# Patient Record
Sex: Female | Born: 1960 | Race: White | Hispanic: No | Marital: Married | Smoking: Never smoker
Health system: Southern US, Community
[De-identification: ages and names within clinical notes are randomized; demographics above are authoritative.]

## PROBLEM LIST (undated history)

## (undated) DIAGNOSIS — J45909 Unspecified asthma, uncomplicated: Secondary | ICD-10-CM

## (undated) DIAGNOSIS — K219 Gastro-esophageal reflux disease without esophagitis: Secondary | ICD-10-CM

## (undated) DIAGNOSIS — N809 Endometriosis, unspecified: Secondary | ICD-10-CM

## (undated) DIAGNOSIS — K5792 Diverticulitis of intestine, part unspecified, without perforation or abscess without bleeding: Secondary | ICD-10-CM

## (undated) DIAGNOSIS — D351 Benign neoplasm of parathyroid gland: Secondary | ICD-10-CM

## (undated) DIAGNOSIS — T8859XA Other complications of anesthesia, initial encounter: Secondary | ICD-10-CM

## (undated) DIAGNOSIS — Z9889 Other specified postprocedural states: Secondary | ICD-10-CM

## (undated) DIAGNOSIS — Z79899 Other long term (current) drug therapy: Secondary | ICD-10-CM

## (undated) DIAGNOSIS — R112 Nausea with vomiting, unspecified: Secondary | ICD-10-CM

## (undated) DIAGNOSIS — K30 Functional dyspepsia: Secondary | ICD-10-CM

## (undated) DIAGNOSIS — T4145XA Adverse effect of unspecified anesthetic, initial encounter: Secondary | ICD-10-CM

## (undated) HISTORY — PX: FOOT SURGERY: SHX648

## (undated) HISTORY — DX: Other long term (current) drug therapy: Z79.899

## (undated) HISTORY — PX: COLON SURGERY: SHX602

## (undated) HISTORY — DX: Unspecified asthma, uncomplicated: J45.909

## (undated) HISTORY — PX: RECONSTRUCTION OF NOSE: SHX2301

## (undated) HISTORY — DX: Endometriosis, unspecified: N80.9

## (undated) HISTORY — PX: SALPINGOOPHORECTOMY: SHX82

## (undated) HISTORY — DX: Diverticulitis of intestine, part unspecified, without perforation or abscess without bleeding: K57.92

## (undated) HISTORY — PX: OTHER SURGICAL HISTORY: SHX169

## (undated) HISTORY — PX: SKIN SURGERY: SHX2413

## (undated) HISTORY — PX: TONSILLECTOMY: SUR1361

## (undated) HISTORY — PX: ABDOMINAL HYSTERECTOMY: SHX81

## (undated) HISTORY — PX: BREAST REDUCTION SURGERY: SHX8

## (undated) HISTORY — DX: Functional dyspepsia: K30

---

## 1997-06-05 HISTORY — PX: CHOLECYSTECTOMY: SHX55

## 2011-05-10 HISTORY — PX: ESOPHAGOGASTRODUODENOSCOPY: SHX1529

## 2012-06-25 ENCOUNTER — Ambulatory Visit: Payer: Self-pay | Admitting: Urgent Care

## 2012-06-27 ENCOUNTER — Encounter: Payer: Self-pay | Admitting: Urgent Care

## 2012-06-27 ENCOUNTER — Ambulatory Visit (INDEPENDENT_AMBULATORY_CARE_PROVIDER_SITE_OTHER): Payer: BC Managed Care – PPO | Admitting: Urgent Care

## 2012-06-27 ENCOUNTER — Encounter (HOSPITAL_COMMUNITY): Payer: Self-pay | Admitting: Pharmacy Technician

## 2012-06-27 VITALS — BP 130/86 | HR 68 | Temp 98.4°F | Ht 62.0 in | Wt 193.8 lb

## 2012-06-27 DIAGNOSIS — R1032 Left lower quadrant pain: Secondary | ICD-10-CM

## 2012-06-27 DIAGNOSIS — Z1211 Encounter for screening for malignant neoplasm of colon: Secondary | ICD-10-CM

## 2012-06-27 MED ORDER — PEG 3350-KCL-NA BICARB-NACL 420 G PO SOLR: 4000.0000 mL | ORAL | Status: DC

## 2012-06-27 NOTE — Progress Notes (Signed)
Referring Provider: Dr Emelda Fear, Denise Mourning, NP @ [facility] Primary Care Physician:  Denise Burrow, MD Primary Gastroenterologist:  Dr. Jonette Taylor  Chief Complaint  Patient presents with  . Colonoscopy    HPI:  Denise Taylor is a 52 y.o. female here as a referral from Dr. Emelda Fear for left lower quadrant pain.  [name] describes a nagging pressure in her LLQ for several months.  She presented to her GYN NP Denise Taylor for evaluation as she has hx of endometriosis & thought she may have had recurrent problems with this.  She had a normal pelvic ultrasound.  Her LLQ pain has almost resolved at this point.  She describes it as 1/10 nagging pressure that radiates into her rectum.   She denies any fever, chills, nausea or vomiting.  She denies any constipation or diarrhea.  Her weight has been stable.  Her appetite is good.  No aggravating or alleviating factors.    Past Medical History  Diagnosis Date  . Endometriosis     Past Surgical History  Procedure Date  . Abdominal hysterectomy     partial  . Esophagogastroduodenoscopy 05/10/11    Teena Dunk: mild narrowing GE junction, Savory dilation  . Tonsillectomy   . Cholecystectomy 1999  . Breast reduction surgery   . Foot surgery     left  . Salpingoophorectomy     right  . Endometrial mass     Current Outpatient Prescriptions  Medication Sig Dispense Refill  . omeprazole (PRILOSEC) 20 MG capsule Take 20 mg by mouth daily as needed. Indigestion.      . polyethylene glycol-electrolytes (TRILYTE) 420 G solution Take 4,000 mLs by mouth as directed.  4000 mL  0  . VIVELLE-DOT 0.0375 MG/24HR Place 1 patch onto the skin 2 (two) times a week.         Allergies as of 06/27/2012  . (No Known Allergies)    Family History:There is no known family history of colorectal carcinoma , liver disease, or inflammatory bowel disease.  Problem Relation Age of Onset  . COPD Mother     History   Social History  . Marital  Status: Married    Spouse Name: N/A    Number of Children: 2  . Years of Education: N/A   Occupational History  . Pharmacist, [employer], [place]    Social History Main Topics  . Smoking status: Never Smoker   . Smokeless tobacco: Not on file  . Alcohol Use: Yes     Comment: 3-4 beers per week  . Drug Use: No  . Sexually Active: Not on file   Other Topics Concern  . Not on file   Social History Narrative   Lives w/ husband    Review of Systems: Gen: Denies any fever, chills, sweats, anorexia, fatigue, weakness, malaise, weight loss, and sleep disorder CV: Denies chest pain, angina, palpitations, syncope, orthopnea, PND, peripheral edema, and claudication. Resp: Denies dyspnea at rest, dyspnea with exercise, cough, sputum, wheezing, coughing up blood, and pleurisy. GI: Denies vomiting blood, jaundice, and fecal incontinence. GU : Denies urinary burning, blood in urine, urinary frequency, urinary hesitancy, nocturnal urination, and urinary incontinence. MS: Denies joint pain, limitation of movement, and swelling, stiffness, low back pain, extremity pain. Denies muscle weakness, cramps, atrophy.  Derm: Denies rash, itching, dry skin, hives, moles, warts, or unhealing ulcers.  Psych: Denies depression, anxiety, memory loss, suicidal ideation, hallucinations, paranoia, and confusion. Heme: Denies bruising, bleeding, and enlarged lymph nodes. Neuro:  Denies any  headaches, dizziness, paresthesias. Endo:  Denies any problems with DM, thyroid, adrenal function.  Physical Exam: BP 130/86  Pulse 68  Temp 98.4 F (36.9 C) (Oral)  Ht 5\' 2"  (1.575 m)  Wt 193 lb 12.8 oz (87.907 kg)  BMI 35.45 kg/m2 No LMP recorded. Patient has had a hysterectomy. General:   Alert,  Well-developed, well-nourished, pleasant and cooperative in NAD.  Accompanied by her precious 4-yr old nephew today. Head:  Normocephalic and atraumatic. Eyes:  Sclera clear, no icterus.   Conjunctiva pink. Ears:  Normal  auditory acuity. Nose:  No deformity, discharge, or lesions. Mouth:  No deformity or lesions,oropharynx pink & moist. Neck:  Supple; no masses or thyromegaly. Lungs:  Clear throughout to auscultation.   No wheezes, crackles, or rhonchi. No acute distress. Heart:  Regular rate and rhythm; no murmurs, clicks, rubs,  or gallops. Abdomen:  Normal bowel sounds.  No bruits.  Soft, non-distended without masses, hepatosplenomegaly or hernias noted.  +very mild LLQ tenderness to deep palpation.  No guarding or rebound tenderness.   Rectal:  Deferred. Msk:  Symmetrical without gross deformities. Normal posture. Pulses:  Normal pulses noted. Extremities:  No clubbing or edema. Neurologic:  Alert and oriented x4;  grossly normal neurologically. Skin:  Intact without significant lesions or rashes. Lymph Nodes:  No significant cervical adenopathy. Psych:  Alert and cooperative. Normal mood and affect.

## 2012-06-27 NOTE — Patient Instructions (Addendum)
Screening colonoscopy with Dr Darrick Penna If you develop worsening abdominal pain, fever, nausea or vomiting, please let us know Abdominal Pain Abdominal pain can be caused by many things. Your caregiver decides the seriousness of your pain by an examination and possibly blood tests and X-rays. Many cases can be observed and treated at home. Most abdominal pain is not caused by a disease and will probably improve without treatment. However, in many cases, more time must pass before a clear cause of the pain can be found. Before that point, it may not be known if you need more testing, or if hospitalization or surgery is needed. HOME CARE INSTRUCTIONS   Do not take laxatives unless directed by your caregiver.  Take pain medicine only as directed by your caregiver.  Only take over-the-counter or prescription medicines for pain, discomfort, or fever as directed by your caregiver.  Try a clear liquid diet (broth, tea, or water) for as long as directed by your caregiver. Slowly move to a bland diet as tolerated. SEEK IMMEDIATE MEDICAL CARE IF:   The pain does not go away.  You have a fever.  You keep throwing up (vomiting).  The pain is felt only in portions of the abdomen. Pain in the right side could possibly be appendicitis. In an adult, pain in the left lower portion of the abdomen could be colitis or diverticulitis.  You pass bloody or black tarry stools. MAKE SURE YOU:   Understand these instructions.  Will watch your condition.  Will get help right away if you are not doing well or get worse. Document Released: 03/01/2005 Document Revised: 08/14/2011 Document Reviewed: 01/08/2008 [facility] Patient Information 2013 [place], [place].

## 2012-06-27 NOTE — Progress Notes (Signed)
Faxed to PCP

## 2012-06-27 NOTE — Assessment & Plan Note (Addendum)
Denise Taylor is a pleasant 52 y.o. female with nagging LLQ/pelvic pain she rates 1/10 without any associated symptoms, aggravating or alleviating factors.  Hx endometriosis with recent normal pelvic ultrasound.  No alarm features.  I suspect her pain may be due to endometriosis or adhesive disease.  Her symptoms would be atypical for diverticulitis.  She has never had a screening colonoscopy before.  Screening colonoscopy with Dr Darrick Penna.  I have discussed risks & benefits which include, but are not limited to, bleeding, infection, perforation & drug reaction.  The patient agrees with this plan & written consent will be obtained.    Abdominal pain literature given Advised to call if she develops worsening abdominal pain, fever, nausea or vomiting

## 2012-07-05 ENCOUNTER — Encounter (HOSPITAL_COMMUNITY): Payer: Self-pay

## 2012-07-05 ENCOUNTER — Ambulatory Visit (HOSPITAL_COMMUNITY)
Admission: RE | Admit: 2012-07-05 | Discharge: 2012-07-05 | Disposition: A | Discharge status: Home / Self Care | Payer: BC Managed Care – PPO | Source: Ambulatory Visit | Attending: Gastroenterology | Admitting: Gastroenterology

## 2012-07-05 ENCOUNTER — Encounter (HOSPITAL_COMMUNITY)
Admission: RE | Disposition: A | Discharge status: Home / Self Care | Payer: Self-pay | Source: Ambulatory Visit | Attending: Gastroenterology

## 2012-07-05 DIAGNOSIS — D126 Benign neoplasm of colon, unspecified: Secondary | ICD-10-CM

## 2012-07-05 DIAGNOSIS — K5730 Diverticulosis of large intestine without perforation or abscess without bleeding: Secondary | ICD-10-CM | POA: Insufficient documentation

## 2012-07-05 DIAGNOSIS — Z1211 Encounter for screening for malignant neoplasm of colon: Secondary | ICD-10-CM | POA: Insufficient documentation

## 2012-07-05 DIAGNOSIS — K648 Other hemorrhoids: Secondary | ICD-10-CM

## 2012-07-05 HISTORY — PX: COLONOSCOPY: SHX5424

## 2012-07-05 SURGERY — COLONOSCOPY
Anesthesia: Moderate Sedation

## 2012-07-05 MED ORDER — ONDANSETRON HCL 4 MG/2ML IJ SOLN
4.0000 mg | Freq: Once | INTRAMUSCULAR | Status: AC
  Administered 2012-07-05: 4 mg via INTRAVENOUS

## 2012-07-05 MED ORDER — MIDAZOLAM HCL 5 MG/5ML IJ SOLN
INTRAMUSCULAR | Status: AC
  Filled 2012-07-05: qty 10

## 2012-07-05 MED ORDER — MEPERIDINE HCL 100 MG/ML IJ SOLN
INTRAMUSCULAR | Status: AC
  Filled 2012-07-05: qty 2

## 2012-07-05 MED ORDER — SODIUM CHLORIDE 0.45 % IV SOLN
INTRAVENOUS | Status: DC
  Administered 2012-07-05: 09:00:00 via INTRAVENOUS

## 2012-07-05 MED ORDER — MIDAZOLAM HCL 5 MG/5ML IJ SOLN
INTRAMUSCULAR | Status: DC | PRN
  Administered 2012-07-05: 1 mg via INTRAVENOUS
  Administered 2012-07-05 (×2): 2 mg via INTRAVENOUS

## 2012-07-05 MED ORDER — ONDANSETRON HCL 4 MG/2ML IJ SOLN
INTRAMUSCULAR | Status: AC
  Filled 2012-07-05: qty 2

## 2012-07-05 MED ORDER — MEPERIDINE HCL 100 MG/ML IJ SOLN
INTRAMUSCULAR | Status: DC | PRN
  Administered 2012-07-05: 50 mg via INTRAVENOUS
  Administered 2012-07-05: 25 mg via INTRAVENOUS

## 2012-07-05 NOTE — H&P (Signed)
  Primary Care Physician:  Selinda Flavin, MD Primary Gastroenterologist:  Dr. Darrick Penna  Pre-Procedure History & Physical: HPI:  Denise Taylor is a 52 y.o. female here for COLON CANCER SCREENING.  Past Medical History  Diagnosis Date  . Endometriosis     Past Surgical History  Procedure Date  . Abdominal hysterectomy     partial  . Esophagogastroduodenoscopy 05/10/11    Teena Dunk: mild narrowing GE junction, Savory dilation  . Tonsillectomy   . Cholecystectomy 1999  . Breast reduction surgery   . Foot surgery     left  . Salpingoophorectomy     right  . Endometrial mass     Prior to Admission medications   Medication Sig Start Date End Date Taking? Authorizing Provider  omeprazole (PRILOSEC) 20 MG capsule Take 20 mg by mouth daily as needed. Indigestion.   Yes Historical Provider, MD  VIVELLE-DOT 0.0375 MG/24HR Place 1 patch onto the skin 2 (two) times a week.  03/25/12  Yes Historical Provider, MD    Allergies as of 06/27/2012  . (No Known Allergies)    Family History  Problem Relation Age of Onset  . COPD Mother     History   Social History  . Marital Status: Married    Spouse Name: N/A    Number of Children: 2  . Years of Education: N/A   Occupational History  . Pharmacist, [employer], [place]    Social History Main Topics  . Smoking status: Never Smoker   . Smokeless tobacco: Not on file  . Alcohol Use: Yes     Comment: 3-4 beers per week  . Drug Use: No  . Sexually Active: Not on file   Other Topics Concern  . Not on file   Social History Narrative   Lives w/ husband    Review of Systems: See HPI, otherwise negative ROS   Physical Exam: BP 127/81  Pulse 66  Temp 97.5 F (36.4 C)  Resp 22  Ht 5\' 2"  (1.575 m)  Wt 193 lb (87.544 kg)  BMI 35.30 kg/m2  SpO2 98% General:   Alert,  pleasant and cooperative in NAD Head:  Normocephalic and atraumatic. Neck:  Supple; Lungs:  Clear throughout to auscultation.    Heart:  Regular rate and  rhythm. Abdomen:  Soft, nontender and nondistended. Normal bowel sounds, without guarding, and without rebound.   Neurologic:  Alert and  oriented x4;  grossly normal neurologically.  Impression/Plan:    SCREENING  Plan:  1. TCS TODAY

## 2012-07-05 NOTE — Op Note (Signed)
[facility] [address] [place], [address]   COLONOSCOPY PROCEDURE REPORT  PATIENT: Denise Taylor, Denise Taylor  MR#: [id number] BIRTHDATE: 1961-01-08 , 51  yrs. old GENDER: Female ENDOSCOPIST: Jonette Eva, MD REFERRED XB:JYNWG Howard, M.D. PROCEDURE DATE:  07/05/2012 PROCEDURE:   ILEOColonoscopy with cold biopsy polypectomy INDICATIONS:Average risk patient for colon cancer. MEDICATIONS: Demerol 75 mg IV and Versed 5 mg IV  DESCRIPTION OF PROCEDURE:    Physical exam was performed.  Informed consent was obtained from the patient after explaining the benefits, risks, and alternatives to procedure.  The patient was connected to monitor and placed in left lateral position. Continuous oxygen was provided by nasal cannula and IV medicine administered through an indwelling cannula.  After administration of sedation and rectal exam, the patients rectum was intubated and the Pentax Colonoscope [phone]  colonoscope was advanced under direct visualization to the ileum.  The scope was removed slowly by carefully examining the color, texture, anatomy, and integrity mucosa on the way out.  The patient was recovered in endoscopy and discharged home in satisfactory condition.    COLON FINDINGS: Two smooth polyps measuring 3-4 mm in size were found in the descending colon and sigmoid colon.  A polypectomy was performed with cold forceps.  , Moderate diverticulosis was noted in the transverse colon, descending colon, and sigmoid colon.  , and Small internal hemorrhoids were found.  PREP QUALITY: good. CECAL W/D TIME: 9 minutes COMPLICATIONS: None  ENDOSCOPIC IMPRESSION: 1.   Two COLON polyps 2.   Moderate diverticulosis was noted in the transverse colon, descending colon, and sigmoid colon 3.   Small internal hemorrhoids  RECOMMENDATIONS: FOLLOW A HIGH FIBER DIET.  AVOID ITEMS THAT CAUSE BLOATING. BIOPSY RESULTS SHOULD BE BACK IN 7 DAYS. Next colonoscopy in 10  years.       _______________________________ Rosalie DoctorJonette Eva, MD 07/05/2012 2:59 PM     PATIENT NAME:  Denise Taylor, Denise Taylor MR#: [id number]

## 2012-07-10 ENCOUNTER — Telehealth: Payer: Self-pay | Admitting: Gastroenterology

## 2012-07-10 NOTE — Telephone Encounter (Signed)
Please call pt. SHE had A simple adenoma removed from HER colon. FOLLOW A High fiber diet. TCS IN 10 YEARS.

## 2012-07-10 NOTE — Telephone Encounter (Signed)
Called and informed pt.  

## 2012-07-10 NOTE — Telephone Encounter (Signed)
Path faxed to PCP, recall made 

## 2012-10-01 NOTE — Progress Notes (Signed)
TCS JAN 2014 SIMPLE ADENOMA(1), pTICS, IH  REVIEWED.

## 2012-11-28 ENCOUNTER — Other Ambulatory Visit: Payer: Self-pay | Admitting: *Deleted

## 2012-11-28 MED ORDER — ESTRADIOL 0.0375 MG/24HR TD PTTW: 1.0000 | MEDICATED_PATCH | TRANSDERMAL | Status: DC

## 2014-01-21 ENCOUNTER — Other Ambulatory Visit: Payer: Self-pay | Admitting: Adult Health

## 2014-02-13 ENCOUNTER — Ambulatory Visit (INDEPENDENT_AMBULATORY_CARE_PROVIDER_SITE_OTHER): Payer: BC Managed Care – PPO | Admitting: Adult Health

## 2014-02-13 ENCOUNTER — Encounter: Payer: Self-pay | Admitting: Adult Health

## 2014-02-13 VITALS — BP 130/80 | HR 74 | Ht 62.5 in | Wt 183.0 lb

## 2014-02-13 DIAGNOSIS — K30 Functional dyspepsia: Secondary | ICD-10-CM

## 2014-02-13 DIAGNOSIS — Z79899 Other long term (current) drug therapy: Secondary | ICD-10-CM

## 2014-02-13 DIAGNOSIS — Z1212 Encounter for screening for malignant neoplasm of rectum: Secondary | ICD-10-CM

## 2014-02-13 DIAGNOSIS — Z79818 Long term (current) use of other agents affecting estrogen receptors and estrogen levels: Secondary | ICD-10-CM

## 2014-02-13 DIAGNOSIS — Z01419 Encounter for gynecological examination (general) (routine) without abnormal findings: Secondary | ICD-10-CM

## 2014-02-13 HISTORY — DX: Long term (current) use of other agents affecting estrogen receptors and estrogen levels: Z79.818

## 2014-02-13 HISTORY — DX: Functional dyspepsia: K30

## 2014-02-13 HISTORY — DX: Other long term (current) drug therapy: Z79.899

## 2014-02-13 LAB — HEMOCCULT GUIAC POC 1CARD (OFFICE): FECAL OCCULT BLD: NEGATIVE

## 2014-02-13 MED ORDER — ESTRADIOL 0.0375 MG/24HR TD PTTW: MEDICATED_PATCH | TRANSDERMAL | Status: DC

## 2014-02-13 MED ORDER — OMEPRAZOLE 20 MG PO CPDR: 20.0000 mg | DELAYED_RELEASE_CAPSULE | Freq: Every day | ORAL | Status: DC | PRN

## 2014-02-13 NOTE — Patient Instructions (Signed)
Physical in 1 year Mammogram now and yearly colonoscopy 2024

## 2014-02-13 NOTE — Progress Notes (Signed)
Patient ID: Denise Taylor, female   DOB: 09-22-1960, 53 y.o.   MRN: [id number] History of Present Illness: Denise Taylor is a 53 year old white female in for a physical, has some indigestion using OTC prilosec, is still using patches and is happy.She has gotten her flu shot for this year.   Current Medications, Allergies, Past Medical History, Past Surgical History, Family History and Social History were reviewed in [employer] record.     Review of Systems: Patient denies any headaches, blurred vision, shortness of breath, chest pain, abdominal pain, problems with bowel movements, urination, or intercourse. No joint pain or mood swings.    Physical Exam: BP 130/80  Pulse 74  Ht 5' 2.5" (1.588 m)  Wt 183 lb (83.008 kg)  BMI 32.92 kg/m2 General:  Well developed, well nourished, no acute distress Skin:  Warm and dry Neck:  Midline trachea, normal thyroid Lungs; Clear to auscultation bilaterally Breast:  No dominant palpable mass, retraction, or nipple discharge,sp reduction  And has some irregularities bilaterally Cardiovascular: Regular rate and rhythm Abdomen:  Soft, non tender, no hepatosplenomegaly Pelvic:  External genitalia is normal in appearance.  The vagina is normal in appearance.The cervix and uterus are absent. No  adnexal masses or tenderness noted. Rectal: Good sphincter tone, no polyps, or hemorrhoids felt.  Hemoccult negative. 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

## 2014-04-06 ENCOUNTER — Encounter: Payer: Self-pay | Admitting: Adult Health

## 2015-02-04 ENCOUNTER — Other Ambulatory Visit: Payer: Self-pay | Admitting: *Deleted

## 2015-02-09 MED ORDER — ESTRADIOL 0.0375 MG/24HR TD PTTW: MEDICATED_PATCH | TRANSDERMAL | Status: DC

## 2015-02-17 ENCOUNTER — Emergency Department (HOSPITAL_COMMUNITY)
Admission: EM | Admit: 2015-02-17 | Discharge: 2015-02-18 | Disposition: A | Discharge status: Home / Self Care | Payer: 59 | Attending: Emergency Medicine | Admitting: Emergency Medicine

## 2015-02-17 ENCOUNTER — Encounter (HOSPITAL_COMMUNITY): Payer: Self-pay | Admitting: *Deleted

## 2015-02-17 ENCOUNTER — Emergency Department (HOSPITAL_COMMUNITY): Payer: 59

## 2015-02-17 DIAGNOSIS — Z9049 Acquired absence of other specified parts of digestive tract: Secondary | ICD-10-CM | POA: Diagnosis not present

## 2015-02-17 DIAGNOSIS — K5732 Diverticulitis of large intestine without perforation or abscess without bleeding: Secondary | ICD-10-CM

## 2015-02-17 DIAGNOSIS — Z8742 Personal history of other diseases of the female genital tract: Secondary | ICD-10-CM | POA: Insufficient documentation

## 2015-02-17 DIAGNOSIS — R350 Frequency of micturition: Secondary | ICD-10-CM | POA: Diagnosis not present

## 2015-02-17 DIAGNOSIS — R51 Headache: Secondary | ICD-10-CM | POA: Diagnosis not present

## 2015-02-17 DIAGNOSIS — R1084 Generalized abdominal pain: Secondary | ICD-10-CM | POA: Diagnosis present

## 2015-02-17 DIAGNOSIS — K5730 Diverticulosis of large intestine without perforation or abscess without bleeding: Secondary | ICD-10-CM | POA: Diagnosis not present

## 2015-02-17 DIAGNOSIS — R110 Nausea: Secondary | ICD-10-CM | POA: Diagnosis not present

## 2015-02-17 DIAGNOSIS — Z90710 Acquired absence of both cervix and uterus: Secondary | ICD-10-CM | POA: Diagnosis not present

## 2015-02-17 LAB — BASIC METABOLIC PANEL
ANION GAP: 7 (ref 5–15)
BUN: 14 mg/dL (ref 6–20)
CALCIUM: 8.6 mg/dL — AB (ref 8.9–10.3)
CO2: 27 mmol/L (ref 22–32)
Chloride: 103 mmol/L (ref 101–111)
Creatinine, Ser: 0.99 mg/dL (ref 0.44–1.00)
GLUCOSE: 105 mg/dL — AB (ref 65–99)
POTASSIUM: 3.9 mmol/L (ref 3.5–5.1)
SODIUM: 137 mmol/L (ref 135–145)

## 2015-02-17 LAB — CBC WITH DIFFERENTIAL/PLATELET
BASOS ABS: 0 10*3/uL (ref 0.0–0.1)
BASOS PCT: 0 %
EOS ABS: 0.5 10*3/uL (ref 0.0–0.7)
EOS PCT: 5 %
HCT: 37.5 % (ref 36.0–46.0)
Hemoglobin: 12.7 g/dL (ref 12.0–15.0)
Lymphocytes Relative: 20 %
Lymphs Abs: 2.2 10*3/uL (ref 0.7–4.0)
MCH: 33 pg (ref 26.0–34.0)
MCHC: 33.9 g/dL (ref 30.0–36.0)
MCV: 97.4 fL (ref 78.0–100.0)
MONO ABS: 0.9 10*3/uL (ref 0.1–1.0)
Monocytes Relative: 8 %
Neutro Abs: 7.4 10*3/uL (ref 1.7–7.7)
Neutrophils Relative %: 67 %
PLATELETS: 203 10*3/uL (ref 150–400)
RBC: 3.85 MIL/uL — AB (ref 3.87–5.11)
RDW: 12.3 % (ref 11.5–15.5)
WBC: 11.1 10*3/uL — AB (ref 4.0–10.5)

## 2015-02-17 LAB — URINALYSIS, ROUTINE W REFLEX MICROSCOPIC
BILIRUBIN URINE: NEGATIVE
GLUCOSE, UA: NEGATIVE mg/dL
Leukocytes, UA: NEGATIVE
NITRITE: NEGATIVE
PH: 6 (ref 5.0–8.0)
PROTEIN: NEGATIVE mg/dL
Specific Gravity, Urine: 1.025 (ref 1.005–1.030)
Urobilinogen, UA: 0.2 mg/dL (ref 0.0–1.0)

## 2015-02-17 LAB — URINE MICROSCOPIC-ADD ON

## 2015-02-17 MED ORDER — IBUPROFEN 800 MG PO TABS: 800.0000 mg | ORAL_TABLET | Freq: Three times a day (TID) | ORAL | Status: DC

## 2015-02-17 MED ORDER — METRONIDAZOLE 500 MG PO TABS
500.0000 mg | ORAL_TABLET | Freq: Once | ORAL | Status: AC
  Administered 2015-02-18: 500 mg via ORAL
  Filled 2015-02-17: qty 1

## 2015-02-17 MED ORDER — METRONIDAZOLE 500 MG PO TABS: 500.0000 mg | ORAL_TABLET | Freq: Two times a day (BID) | ORAL | Status: DC

## 2015-02-17 MED ORDER — FENTANYL CITRATE (PF) 100 MCG/2ML IJ SOLN
50.0000 ug | Freq: Once | INTRAMUSCULAR | Status: AC
  Administered 2015-02-17: 50 ug via INTRAVENOUS
  Filled 2015-02-17: qty 2

## 2015-02-17 MED ORDER — SODIUM CHLORIDE 0.9 % IV BOLUS (SEPSIS)
1000.0000 mL | Freq: Once | INTRAVENOUS | Status: AC
  Administered 2015-02-17: 1000 mL via INTRAVENOUS

## 2015-02-17 MED ORDER — IOHEXOL 300 MG/ML  SOLN
50.0000 mL | Freq: Once | INTRAMUSCULAR | Status: AC | PRN
  Administered 2015-02-17: 100 mL via INTRAVENOUS

## 2015-02-17 MED ORDER — CIPROFLOXACIN HCL 500 MG PO TABS: 500.0000 mg | ORAL_TABLET | Freq: Two times a day (BID) | ORAL | Status: DC

## 2015-02-17 MED ORDER — CIPROFLOXACIN IN D5W 400 MG/200ML IV SOLN
400.0000 mg | Freq: Once | INTRAVENOUS | Status: AC
  Administered 2015-02-18: 400 mg via INTRAVENOUS
  Filled 2015-02-17: qty 200

## 2015-02-17 MED ORDER — ONDANSETRON HCL 4 MG/2ML IJ SOLN
4.0000 mg | Freq: Once | INTRAMUSCULAR | Status: AC
  Administered 2015-02-17: 4 mg via INTRAVENOUS
  Filled 2015-02-17: qty 2

## 2015-02-17 MED ORDER — IOHEXOL 300 MG/ML  SOLN
50.0000 mL | Freq: Once | INTRAMUSCULAR | Status: AC | PRN
  Administered 2015-02-17: 50 mL via ORAL

## 2015-02-17 NOTE — ED Notes (Signed)
Pt with abd pain, denies N/V/D, LBM a week ago, denies burning on urination

## 2015-02-17 NOTE — Discharge Instructions (Signed)

## 2015-02-17 NOTE — ED Provider Notes (Signed)
CSN: [id number]     Arrival date & time 02/17/15  2124 History  This chart was scribed for Noemi Chapel, MD by Hilda Lias, ED Scribe. This patient was seen in room APA19/APA19 and the patient's care was started at 9:45 PM.    Chief Complaint  Patient presents with  . Abdominal Pain      The history is provided by the patient. No language interpreter was used.   HPI Comments: Denise Taylor is a 54 y.o. female who presents to the Emergency Department complaining of intermittent generalized abdominal pain that is worst in her RLQ with associated frequent urination, nausea, headache that has been present since last night. Pt states she began having pain last night when she was sleeping, which woke her up. Pt states she has not had a bowel movement in a week or so. Pt reports she always has blood in her urine, and states she has a hx of endometriosis. 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 Pt reports she still has her appendix.    Past Medical History  Diagnosis Date  . Endometriosis   . Current use of estrogen therapy 02/13/2014  . Indigestion 02/13/2014   Past Surgical History  Procedure Laterality Date  . Abdominal hysterectomy      partial  . Esophagogastroduodenoscopy  05/10/11    Britta Mccreedy: mild narrowing GE junction, Savory dilation  . Tonsillectomy    . Cholecystectomy  1999  . Breast reduction surgery    . Foot surgery      left  . Salpingoophorectomy      right  . Endometrial mass    . Colonoscopy  07/05/2012    Procedure: COLONOSCOPY;  Surgeon: Danie Binder, MD;  Location: [facility];  Service: Endoscopy;  Laterality: N/A;  9:30  . Skin surgery      back of left leg   Family History  Problem Relation Age of Onset  . COPD Mother   . Hypertension Father   . Other Brother     paralyzed from waist down; fell out of deer stand  . Other Daughter     born with 2 holes in  heart  . Heart disease Maternal Grandfather   . COPD Maternal Grandfather   . Stroke Paternal Grandmother   . Stroke Paternal Grandfather    Social History  Substance Use Topics  . Smoking status: Never Smoker   . Smokeless tobacco: Never Used  . Alcohol Use: Yes     Comment: once a week   OB History    Gravida Para Term Preterm AB TAB SAB Ectopic Multiple Living   2 2        2      Review of Systems  All other systems reviewed and are negative.     Allergies  Codeine  Home Medications   Prior to Admission medications   Medication Sig Start Date End Date Taking? Authorizing Provider  estradiol (VIVELLE-DOT) 0.0375 MG/24HR apply 1 patch two times a week Patient taking differently: Place 1 patch onto the skin 2 (two) times a week. apply 1 patch two times a week 02/09/15  Yes Estill Dooms, NP  omeprazole (PRILOSEC) 20 MG capsule Take 1 capsule (20 mg total) by mouth daily as needed. Indigestion. 02/13/14  Yes Estill Dooms, NP  ciprofloxacin (CIPRO) 500 MG tablet Take 1 tablet (500 mg total) by mouth every  12 (twelve) hours. 02/17/15   Noemi Chapel, MD  ibuprofen (ADVIL,MOTRIN) 800 MG tablet Take 1 tablet (800 mg total) by mouth 3 (three) times daily. 02/17/15   Noemi Chapel, MD  metroNIDAZOLE (FLAGYL) 500 MG tablet Take 1 tablet (500 mg total) by mouth 2 (two) times daily. 02/17/15   Noemi Chapel, MD   BP 121/70 mmHg  Pulse 86  Temp(Src) 98.5 F (36.9 C) (Oral)  Resp 18  Ht 5\' 2"  (1.575 m)  Wt 189 lb (85.73 kg)  BMI 34.56 kg/m2  SpO2 100% Physical Exam  Constitutional: She appears well-developed and well-nourished. No distress.  HENT:  Head: Normocephalic and atraumatic.  Mouth/Throat: Oropharynx is clear and moist. No oropharyngeal exudate.  Eyes: Conjunctivae and EOM are normal. Pupils are equal, round, and reactive to light. Right eye exhibits no discharge. Left eye exhibits no discharge. No scleral icterus.  Neck: Normal range of motion. Neck supple. No JVD  present. No thyromegaly present.  Cardiovascular: Normal rate, regular rhythm, normal heart sounds and intact distal pulses.  Exam reveals no gallop and no friction rub.   No murmur heard. Pulmonary/Chest: Effort normal and breath sounds normal. No respiratory distress. She has no wheezes. She has no rales.  Abdominal: Soft. Bowel sounds are normal. She exhibits no distension and no mass. There is no tenderness.  Tenderness to McBurney's point on the right  Musculoskeletal: Normal range of motion. She exhibits no edema or tenderness.  Lymphadenopathy:    She has no cervical adenopathy.  Neurological: She is alert. Coordination normal.  Skin: Skin is warm and dry. No rash noted. No erythema.  Psychiatric: She has a normal mood and affect. Her behavior is normal.  Nursing note and vitals reviewed.   ED Course  Procedures (including critical care time)  DIAGNOSTIC STUDIES: Oxygen Saturation is 100% on room air, normal by my interpretation.    COORDINATION OF CARE: 9:48 PM Discussed treatment plan with pt at bedside and pt agreed to plan.   Labs Review Labs Reviewed  CBC WITH DIFFERENTIAL/PLATELET - Abnormal; Notable for the following:    WBC 11.1 (*)    RBC 3.85 (*)    All other components within normal limits  BASIC METABOLIC PANEL - Abnormal; Notable for the following:    Glucose, Bld 105 (*)    Calcium 8.6 (*)    All other components within normal limits  URINALYSIS, ROUTINE W REFLEX MICROSCOPIC (NOT AT [facility]) - Abnormal; Notable for the following:    Hgb urine dipstick MODERATE (*)    Ketones, ur TRACE (*)    All other components within normal limits  URINE MICROSCOPIC-ADD ON    Imaging Review Ct Abdomen Pelvis W Contrast  02/17/2015   CLINICAL DATA:  54 year old female with right lower quadrant abdominal pain  EXAM: CT ABDOMEN AND PELVIS WITH CONTRAST  TECHNIQUE: Multidetector CT imaging of the abdomen and pelvis was performed using the standard protocol following bolus  administration of intravenous contrast.  CONTRAST:  84mL OMNIPAQUE IOHEXOL 300 MG/ML SOLN, 189mL OMNIPAQUE IOHEXOL 300 MG/ML SOLN  COMPARISON:  None.  FINDINGS: The visualized lung bases are clear. No intra-abdominal free air. Small free fluid within the pelvis.  Cholecystectomy. The liver, pancreas, spleen, adrenal glands appear unremarkable. The right kidney and ureter appears unremarkable. There are multiple small left renal probable parapelvic cysts. A 1 cm left renal inferior pole hypodense lesion is not well characterized. Ultrasound is recommended for better evaluation of the kidneys. The urinary bladder appears unremarkable. Hysterectomy.  There is sigmoid diverticulosis with active inflammation compatible with diverticulitis. No drainable fluid collection/abscess. No evidence of perforation. Moderate stool noted throughout the colon. No bowel obstruction. Top-normal caliber loops of small bowel noted within the pelvis likely related to a degree of ileus. The appendix is unremarkable.  The abdominal aorta and IVC appear unremarkable. No portal venous gas identified. There is no lymphadenopathy. The visualized osseous structures appear unremarkable.  IMPRESSION: Sigmoid diverticulosis.  No abscess.  Stop   Electronically Signed   By: Anner Crete M.D.   On: 02/17/2015 23:19   I have personally reviewed and evaluated these images and lab results as part of my medical decision-making.    MDM   Final diagnoses:  Sigmoid diverticulitis    The patient has diverticulitis on CT scan, labs otherwise fairly unremarkable, the patient was informed of her results, she has received medications including IV antibiotics, IV medications and IV fluids. She is feeling better, she wants ibuprofen for home but no opiate medications. She appears stable for discharge after antibiotics given. She has expressed her understanding to the indications for return.  I personally performed the services described in this  documentation, which was scribed in my presence. The recorded information has been reviewed and is accurate.    Meds given in ED:  Medications  ciprofloxacin (CIPRO) IVPB 400 mg (not administered)  metroNIDAZOLE (FLAGYL) tablet 500 mg (not administered)  fentaNYL (SUBLIMAZE) injection 50 mcg (50 mcg Intravenous Given 02/17/15 2214)  ondansetron (ZOFRAN) injection 4 mg (4 mg Intravenous Given 02/17/15 2214)  sodium chloride 0.9 % bolus 1,000 mL (0 mLs Intravenous Stopped 02/17/15 2323)  iohexol (OMNIPAQUE) 300 MG/ML solution 50 mL (100 mLs Intravenous Contrast Given 02/17/15 2246)  iohexol (OMNIPAQUE) 300 MG/ML solution 50 mL (50 mLs Oral Contrast Given 02/17/15 2246)    New Prescriptions   CIPROFLOXACIN (CIPRO) 500 MG TABLET    Take 1 tablet (500 mg total) by mouth every 12 (twelve) hours.   IBUPROFEN (ADVIL,MOTRIN) 800 MG TABLET    Take 1 tablet (800 mg total) by mouth 3 (three) times daily.   METRONIDAZOLE (FLAGYL) 500 MG TABLET    Take 1 tablet (500 mg total) by mouth 2 (two) times daily.       Noemi Chapel, MD 02/17/15 [phone]

## 2016-04-06 ENCOUNTER — Other Ambulatory Visit: Payer: Self-pay | Admitting: Adult Health

## 2016-04-18 ENCOUNTER — Encounter: Payer: Self-pay | Admitting: Adult Health

## 2016-04-18 ENCOUNTER — Ambulatory Visit (INDEPENDENT_AMBULATORY_CARE_PROVIDER_SITE_OTHER): Payer: 59 | Admitting: Adult Health

## 2016-04-18 VITALS — BP 130/72 | HR 82 | Ht 62.5 in | Wt 193.4 lb

## 2016-04-18 DIAGNOSIS — Z1211 Encounter for screening for malignant neoplasm of colon: Secondary | ICD-10-CM

## 2016-04-18 DIAGNOSIS — Z01419 Encounter for gynecological examination (general) (routine) without abnormal findings: Secondary | ICD-10-CM

## 2016-04-18 DIAGNOSIS — Z79899 Other long term (current) drug therapy: Secondary | ICD-10-CM

## 2016-04-18 LAB — HEMOCCULT GUIAC POC 1CARD (OFFICE): FECAL OCCULT BLD: NEGATIVE

## 2016-04-18 MED ORDER — ESTRADIOL 0.0375 MG/24HR TD PTTW: MEDICATED_PATCH | TRANSDERMAL | 4 refills | Status: DC

## 2016-04-18 NOTE — Progress Notes (Signed)
Patient ID: Denise Taylor, female   DOB: [birth year], 55 y.o.   MRN: [id number] History of Present Illness: Denise Taylor is a 55 year old white female, married in for well woman gyn exam, she is sp hysterectomy.She needs refills on her estrogen patches.  PCP is Dr Nadara Mustard.   Current Medications, Allergies, Past Medical History, Past Surgical History, Family History and Social History were reviewed in [employer] record.     Review of Systems: Patient denies any headaches, hearing loss, fatigue, blurred vision, shortness of breath, chest pain, abdominal pain, problems with  urination, or intercourse. No joint pain or mood swings. Has had diverticulosis, and BMs loose.  Physical Exam:BP 130/72   Pulse 82   Ht 5' 2.5" (1.588 m)   Wt 193 lb 6.4 oz (87.7 kg)   BMI 34.81 kg/m  General:  Well developed, well nourished, no acute distress Skin:  Warm and dry Neck:  Midline trachea, normal thyroid, good ROM, no lymphadenopathy Lungs; Clear to auscultation bilaterally Breast:  No dominant palpable mass, retraction, or nipple discharge Cardiovascular: Regular rate and rhythm Abdomen:  Soft, non tender, no hepatosplenomegaly Pelvic:  External genitalia is normal in appearance, no lesions.  The vagina is normal in appearance. Urethra has no lesions or masses. The cervix and uterus are absent.  No adnexal masses or tenderness noted.Bladder is non tender, no masses felt. Rectal: Good sphincter tone, no polyps, + hemorrhoids felt.  Hemoccult negative. Extremities/musculoskeletal:  No swelling or varicosities noted, no clubbing or cyanosis Psych:  No mood changes, alert and cooperative,seems happy PHQ 2 score 0  Impression: 1. Encounter for well woman exam with routine gynecological exam   2. Current use of estrogen therapy       Plan:  Refilled Vivelle dot 0.0375 mg #24 with 4 refills use 1 patch 2 x weekly Physical in 1 year Mammogram yearly colonoscopy per Dr Learta Codding  with PCP

## 2016-04-18 NOTE — Patient Instructions (Signed)
Physical in 1 year Mammogram yearly colonoscopy per Dr Learta Codding with PCP

## 2016-04-25 ENCOUNTER — Other Ambulatory Visit: Payer: Self-pay | Admitting: Adult Health

## 2016-07-27 DIAGNOSIS — L739 Follicular disorder, unspecified: Secondary | ICD-10-CM | POA: Diagnosis not present

## 2016-07-27 DIAGNOSIS — D2239 Melanocytic nevi of other parts of face: Secondary | ICD-10-CM | POA: Diagnosis not present

## 2016-07-27 DIAGNOSIS — D485 Neoplasm of uncertain behavior of skin: Secondary | ICD-10-CM | POA: Diagnosis not present

## 2016-08-02 DIAGNOSIS — L600 Ingrowing nail: Secondary | ICD-10-CM | POA: Diagnosis not present

## 2016-08-02 DIAGNOSIS — M79672 Pain in left foot: Secondary | ICD-10-CM | POA: Diagnosis not present

## 2016-08-02 DIAGNOSIS — L03032 Cellulitis of left toe: Secondary | ICD-10-CM | POA: Diagnosis not present

## 2016-08-11 DIAGNOSIS — J209 Acute bronchitis, unspecified: Secondary | ICD-10-CM | POA: Diagnosis not present

## 2016-08-11 DIAGNOSIS — J019 Acute sinusitis, unspecified: Secondary | ICD-10-CM | POA: Diagnosis not present

## 2016-08-16 DIAGNOSIS — M79672 Pain in left foot: Secondary | ICD-10-CM | POA: Diagnosis not present

## 2016-08-16 DIAGNOSIS — L03032 Cellulitis of left toe: Secondary | ICD-10-CM | POA: Diagnosis not present

## 2016-08-16 DIAGNOSIS — L600 Ingrowing nail: Secondary | ICD-10-CM | POA: Diagnosis not present

## 2017-01-22 DIAGNOSIS — R079 Chest pain, unspecified: Secondary | ICD-10-CM | POA: Diagnosis not present

## 2017-01-26 DIAGNOSIS — R079 Chest pain, unspecified: Secondary | ICD-10-CM | POA: Diagnosis not present

## 2017-04-25 DIAGNOSIS — R509 Fever, unspecified: Secondary | ICD-10-CM | POA: Diagnosis not present

## 2017-04-25 DIAGNOSIS — K5732 Diverticulitis of large intestine without perforation or abscess without bleeding: Secondary | ICD-10-CM | POA: Diagnosis not present

## 2017-05-02 ENCOUNTER — Other Ambulatory Visit: Payer: Self-pay

## 2017-05-02 ENCOUNTER — Ambulatory Visit (INDEPENDENT_AMBULATORY_CARE_PROVIDER_SITE_OTHER): Payer: 59 | Admitting: Nurse Practitioner

## 2017-05-02 ENCOUNTER — Encounter: Payer: Self-pay | Admitting: Nurse Practitioner

## 2017-05-02 VITALS — BP 128/88 | HR 80 | Temp 97.7°F | Ht 62.5 in | Wt 184.0 lb

## 2017-05-02 DIAGNOSIS — K5732 Diverticulitis of large intestine without perforation or abscess without bleeding: Secondary | ICD-10-CM

## 2017-05-02 NOTE — Progress Notes (Signed)
cc'ed to pcp °

## 2017-05-02 NOTE — Patient Instructions (Signed)
1. We will schedule your colonoscopy for you. 2. We will refer you to a surgeon for an initial consult related to repeated diverticulitis. 3. Return for follow-up in 3 months. 4. Call us if you have any questions or concerns.

## 2017-05-02 NOTE — Progress Notes (Signed)
Primary Care Physician:  Rory Percy, MD Primary Gastroenterologist:  Dr. Oneida Alar  Chief Complaint  Patient presents with  . Diverticulitis    4th episode this year.     HPI:   Denise Taylor is a 56 y.o. female who presents for ER follow-up on diverticulitis. She was seen by PCP for diverticulitis and started on po antibiotics without imaging. Has a history of recurrent diverticulitis.  She was seen 02/17/15 for RLQ abdominal pain, nausea, headache, and frequent urination. Pain worke her from sleep. No BM in a week or so. WBC 11.1, H/H normal, other labs essentially normal. CT found sigmoid diverticulitis, no abscess. Moderate colonic stool, no bowel obstruction, top-normal caliber loops of small bowl in the pelvis likely related to a degree of ileus. Felt better after IV abx, fluids, medications. Discharged on po antibiotics and non-opiate pain medications (per patient request).   Last TCS completed 07/05/2012 which found two colon polyps, diverticula in transverse colon, descending colon, and sigmoid colon. Small internal hemorrhoids. Recommended repeat exam 10 years. Surgical pathology found the polyps to be one tubular adenoma and 1 hyperplastic.  Today she states she's feeling better this week than last week. States this episode was the worst she had yet. Has had 4 episodes of diverticulitis in the past 1-2 years. Abdominal pain LLQ,, now 4/10 'a lot better than last week." Stools have more substance to them now, was having looser stools during her flare. She admits she had a fever when she saw her PCP. Denies N/V, hematochezia, melena,  Current fever/chills, unintentional weight loss. Denies chest pain, dyspnea, dizziness, lightheadedness, syncope, near syncope. Denies any other upper or lower GI symptoms.  Past Medical History:  Diagnosis Date  . Current use of estrogen therapy 02/13/2014  . Diverticulitis    4 episodes in the past 1-2 years.  . Endometriosis   . Indigestion  02/13/2014    Past Surgical History:  Procedure Laterality Date  . ABDOMINAL HYSTERECTOMY     partial  . BREAST REDUCTION SURGERY    . CHOLECYSTECTOMY  1999  . COLONOSCOPY  07/05/2012   Procedure: COLONOSCOPY;  Surgeon: Danie Binder, MD;  Location: [facility];  Service: Endoscopy;  Laterality: N/A;  9:30  . endometrial mass    . ESOPHAGOGASTRODUODENOSCOPY  05/10/11   Benson: mild narrowing GE junction, Savory dilation  . FOOT SURGERY     left  . SALPINGOOPHORECTOMY     right  . SKIN SURGERY     back of left leg  . TONSILLECTOMY      Current Outpatient Medications  Medication Sig Dispense Refill  . ciprofloxacin (CIPRO) 500 MG tablet Take 1 tablet (500 mg total) by mouth every 12 (twelve) hours. 20 tablet 0  . EPIPEN 2-PAK 0.3 MG/0.3ML SOAJ injection Inject 0.3 mg into the muscle as needed.     Marland Kitchen estradiol (VIVELLE-DOT) 0.0375 MG/24HR APPLY 1 PATCH TWO TIMES A WEEK 24 patch 4  . ibuprofen (ADVIL,MOTRIN) 800 MG tablet Take 1 tablet (800 mg total) by mouth 3 (three) times daily. 21 tablet 0  . metroNIDAZOLE (FLAGYL) 500 MG tablet Take 1 tablet (500 mg total) by mouth 2 (two) times daily. 20 tablet 0  . omeprazole (PRILOSEC) 20 MG capsule Take 1 capsule (20 mg total) by mouth daily as needed. Indigestion. 30 capsule 11   No current facility-administered medications for this visit.     Allergies as of 05/02/2017 - Review Complete 05/02/2017  Allergen Reaction Noted  . Codeine  Nausea Only 02/17/2015    Family History  Problem Relation Age of Onset  . COPD Mother   . Hypertension Father   . Other Brother        paralyzed from waist down; fell out of deer stand  . Other Daughter        born with 2 holes in heart  . Heart disease Maternal Grandfather   . COPD Maternal Grandfather   . Stroke Paternal Grandmother   . Stroke Paternal Grandfather   . Colon cancer Neg Hx     Social History   Socioeconomic History  . Marital status: Married    Spouse name: Not on file    . Number of children: 2  . Years of education: Not on file  . Highest education level: Not on file  Social Needs  . Financial resource strain: Not on file  . Food insecurity - worry: Not on file  . Food insecurity - inability: Not on file  . Transportation needs - medical: Not on file  . Transportation needs - non-medical: Not on file  Occupational History  . Occupation: Software engineer, [employer], [employer]  Tobacco Use  . Smoking status: Never Smoker  . Smokeless tobacco: Never Used  Substance and Sexual Activity  . Alcohol use: Yes    Comment: once a week  . Drug use: No  . Sexual activity: Yes    Partners: Male    Birth control/protection: Surgical    Comment: hyst  Other Topics Concern  . Not on file  Social History Narrative   Lives w/ husband    Review of Systems: Complete ROS negative except as per HPI.    Physical Exam: BP 128/88   Pulse 80   Temp 97.7 F (36.5 C) (Oral)   Ht 5' 2.5" (1.588 m)   Wt 184 lb (83.5 kg)   BMI 33.12 kg/m  General:   Alert and oriented. Pleasant and cooperative. Well-nourished and well-developed.  Eyes:  Without icterus, sclera clear and conjunctiva pink.  Ears:  Normal auditory acuity. Cardiovascular:  S1, S2 present without murmurs appreciated. Extremities without clubbing or edema. Respiratory:  Clear to auscultation bilaterally. No wheezes, rales, or rhonchi. No distress.  Gastrointestinal:  +BS, soft, and non-distended. Mild RLQ TTP. No HSM noted. No guarding or rebound. No masses appreciated.  Rectal:  Deferred  Musculoskalatal:  Symmetrical without gross deformities. Neurologic:  Alert and oriented x4;  grossly normal neurologically. Psych:  Alert and cooperative. Normal mood and affect. Heme/Lymph/Immune: No excessive bruising noted.    05/02/2017 4:06 PM   Disclaimer: This note was dictated with voice recognition software. Similar sounding words can inadvertently be transcribed and may not be corrected upon review.

## 2017-05-02 NOTE — Assessment & Plan Note (Signed)
Patient treated for diverticulitis as an outpatient by primary care. She was given Cipro and Flagyl and she is still taking this. Her symptoms are significantly improved. Abdominal pain is minimal at this time, rated 4 out of 10. Minimal tenderness to palpation. This is her fourth bout of diverticulitis in the past 1-2 years. Her last colonoscopy was approximately 5 years ago. At this point, even though she was recommended to have a 10 year repeat colonoscopy, we will repeat her colonoscopy at early interval due to recurrent diverticulitis. We will also refer her to surgery for outpatient evaluation for possible partial colectomy given recurrent nature of her diverticulitis. Return for follow-up in 3 months.  Proceed with colonoscopy with Dr. Oneida Alar in the near future. The risks, benefits, and alternatives have been discussed in detail with the patient. They state understanding and desire to proceed.   The patient is not on any anticoagulants, anxiolytics, chronic pain medications, or antidepressants. Conscious sedation should be adequate for her procedure.

## 2017-05-03 ENCOUNTER — Telehealth: Payer: Self-pay

## 2017-05-03 ENCOUNTER — Other Ambulatory Visit: Payer: Self-pay

## 2017-05-03 MED ORDER — CLENPIQ 10-3.5-12 MG-GM -GM/160ML PO SOLN: 1.0000 | Freq: Once | ORAL | 0 refills | Status: AC

## 2017-05-03 NOTE — Telephone Encounter (Signed)
Called pt. TCS for 05/07/17 moved up to 10:15am, pt to arrive at 9:15am. Advised her to start drinking 2nd half of prep that morning at 7:15am and NPO after 9:15am. Endo scheduler aware. Rx for prep sent to [facility] in [place].

## 2017-05-03 NOTE — Patient Instructions (Signed)
PA info for TCS submitted via [facility] website. Notification/prior authorization reference number is [id number].

## 2017-05-04 NOTE — Patient Instructions (Signed)
Received fax from [facility] TCS was approved.

## 2017-05-07 ENCOUNTER — Other Ambulatory Visit: Payer: Self-pay

## 2017-05-07 ENCOUNTER — Encounter (HOSPITAL_COMMUNITY)
Admission: RE | Disposition: A | Discharge status: Home / Self Care | Payer: Self-pay | Source: Ambulatory Visit | Attending: Gastroenterology

## 2017-05-07 ENCOUNTER — Ambulatory Visit (HOSPITAL_COMMUNITY)
Admission: RE | Admit: 2017-05-07 | Discharge: 2017-05-07 | Disposition: A | Discharge status: Home / Self Care | Payer: 59 | Source: Ambulatory Visit | Attending: Gastroenterology | Admitting: Gastroenterology

## 2017-05-07 ENCOUNTER — Encounter (HOSPITAL_COMMUNITY): Payer: Self-pay | Admitting: *Deleted

## 2017-05-07 DIAGNOSIS — Z860100 Personal history of colon polyps, unspecified: Secondary | ICD-10-CM

## 2017-05-07 DIAGNOSIS — K5732 Diverticulitis of large intestine without perforation or abscess without bleeding: Secondary | ICD-10-CM

## 2017-05-07 DIAGNOSIS — Z79899 Other long term (current) drug therapy: Secondary | ICD-10-CM | POA: Diagnosis not present

## 2017-05-07 DIAGNOSIS — K648 Other hemorrhoids: Secondary | ICD-10-CM | POA: Diagnosis not present

## 2017-05-07 DIAGNOSIS — Z1211 Encounter for screening for malignant neoplasm of colon: Secondary | ICD-10-CM | POA: Diagnosis not present

## 2017-05-07 DIAGNOSIS — K5730 Diverticulosis of large intestine without perforation or abscess without bleeding: Secondary | ICD-10-CM | POA: Insufficient documentation

## 2017-05-07 DIAGNOSIS — K644 Residual hemorrhoidal skin tags: Secondary | ICD-10-CM | POA: Insufficient documentation

## 2017-05-07 HISTORY — PX: COLONOSCOPY: SHX5424

## 2017-05-07 SURGERY — COLONOSCOPY
Anesthesia: Moderate Sedation

## 2017-05-07 MED ORDER — MIDAZOLAM HCL 5 MG/5ML IJ SOLN
INTRAMUSCULAR | Status: AC
  Filled 2017-05-07: qty 10

## 2017-05-07 MED ORDER — MEPERIDINE HCL 100 MG/ML IJ SOLN
INTRAMUSCULAR | Status: DC | PRN
  Administered 2017-05-07: 50 mg via INTRAVENOUS
  Administered 2017-05-07 (×2): 25 mg via INTRAVENOUS

## 2017-05-07 MED ORDER — MIDAZOLAM HCL 5 MG/5ML IJ SOLN
INTRAMUSCULAR | Status: DC | PRN
  Administered 2017-05-07: 2 mg via INTRAVENOUS
  Administered 2017-05-07: 1 mg via INTRAVENOUS
  Administered 2017-05-07: 2 mg via INTRAVENOUS

## 2017-05-07 MED ORDER — MEPERIDINE HCL 100 MG/ML IJ SOLN
INTRAMUSCULAR | Status: AC
  Filled 2017-05-07: qty 2

## 2017-05-07 MED ORDER — STERILE WATER FOR IRRIGATION IR SOLN
Status: DC | PRN
  Administered 2017-05-07: 100 mL

## 2017-05-07 MED ORDER — SODIUM CHLORIDE 0.9 % IV SOLN
INTRAVENOUS | Status: DC
  Administered 2017-05-07: 10:00:00 via INTRAVENOUS

## 2017-05-07 NOTE — H&P (Signed)
Primary Care Physician:  Rory Percy, MD Primary Gastroenterologist:  Dr. Oneida Alar  Pre-Procedure History & Physical: HPI:  Denise Taylor is a 56 y.o. female here for  PERSONAL HISTORY OF POLYPS.  Past Medical History:  Diagnosis Date  . Current use of estrogen therapy 02/13/2014  . Diverticulitis    4 episodes in the past 1-2 years.  . Endometriosis   . Indigestion 02/13/2014    Past Surgical History:  Procedure Laterality Date  . ABDOMINAL HYSTERECTOMY     partial  . BREAST REDUCTION SURGERY    . CHOLECYSTECTOMY  1999  . COLONOSCOPY  07/05/2012   Procedure: COLONOSCOPY;  Surgeon: Danie Binder, MD;  Location: [facility];  Service: Endoscopy;  Laterality: N/A;  9:30  . endometrial mass    . ESOPHAGOGASTRODUODENOSCOPY  05/10/11   Benson: mild narrowing GE junction, Savory dilation  . FOOT SURGERY     left  . SALPINGOOPHORECTOMY     right  . SKIN SURGERY     back of left leg  . TONSILLECTOMY      Prior to Admission medications   Medication Sig Start Date End Date Taking? Authorizing Provider  ciprofloxacin (CIPRO) 500 MG tablet Take 1 tablet (500 mg total) by mouth every 12 (twelve) hours. 02/17/15  Yes Noemi Chapel, MD  estradiol (VIVELLE-DOT) 0.0375 MG/24HR APPLY 1 PATCH TWO TIMES A WEEK 04/18/16  Yes Derrek Monaco A, NP  metroNIDAZOLE (FLAGYL) 500 MG tablet Take 1 tablet (500 mg total) by mouth 2 (two) times daily. 02/17/15  Yes Noemi Chapel, MD  EPIPEN 2-PAK 0.3 MG/0.3ML SOAJ injection Inject 0.3 mg into the muscle as needed (bee stings).  02/23/16   [provider]  ibuprofen (ADVIL,MOTRIN) 800 MG tablet Take 1 tablet (800 mg total) by mouth 3 (three) times daily. Patient taking differently: Take 800 mg by mouth 3 (three) times daily as needed (migraines).  02/17/15   Noemi Chapel, MD  omeprazole (PRILOSEC) 20 MG capsule Take 1 capsule (20 mg total) by mouth daily as needed. Indigestion. Patient taking differently: Take 20 mg by mouth daily as needed  (indigestion). Indigestion. 02/13/14   Estill Dooms, NP  ondansetron (ZOFRAN-ODT) 4 MG disintegrating tablet Take 4 mg by mouth daily as needed for nausea or vomiting.    [provider]    Allergies as of 05/02/2017 - Review Complete 05/02/2017  Allergen Reaction Noted  . Codeine Nausea Only 02/17/2015    Family History  Problem Relation Age of Onset  . COPD Mother   . Hypertension Father   . Other Brother        paralyzed from waist down; fell out of deer stand  . Other Daughter        born with 2 holes in heart  . Heart disease Maternal Grandfather   . COPD Maternal Grandfather   . Stroke Paternal Grandmother   . Stroke Paternal Grandfather   . Colon cancer Neg Hx     Social History   Socioeconomic History  . Marital status: Married    Spouse name: Not on file  . Number of children: 2  . Years of education: Not on file  . Highest education level: Not on file  Social Needs  . Financial resource strain: Not on file  . Food insecurity - worry: Not on file  . Food insecurity - inability: Not on file  . Transportation needs - medical: Not on file  . Transportation needs - non-medical: Not on file  Occupational History  .  Occupation: Software engineer, [employer], [employer]  Tobacco Use  . Smoking status: Never Smoker  . Smokeless tobacco: Never Used  Substance and Sexual Activity  . Alcohol use: Yes    Comment: once a week  . Drug use: No  . Sexual activity: Yes    Partners: Male    Birth control/protection: Surgical    Comment: hyst  Other Topics Concern  . Not on file  Social History Narrative   Lives w/ husband    Review of Systems: See HPI, otherwise negative ROS   Physical Exam: BP 123/70   Pulse 68   Temp 97.6 F (36.4 C) (Oral)   Ht 5' 2.5" (1.588 m)   Wt 184 lb (83.5 kg)   SpO2 99%   BMI 33.12 kg/m  General:   Alert,  pleasant and cooperative in NAD Head:  Normocephalic and atraumatic. Neck:  Supple; Lungs:  Clear throughout to  auscultation.    Heart:  Regular rate and rhythm. Abdomen:  Soft, nontender and nondistended. Normal bowel sounds, without guarding, and without rebound.   Neurologic:  Alert and  oriented x4;  grossly normal neurologically.  Impression/Plan:     PERSONAL HISTORY OF POLYPS.  PLAN: 1. TCS TODAY DISCUSSED PROCEDURE, BENEFITS, & RISKS: < 1% chance of medication reaction, bleeding, perforation, or rupture of spleen/liver.

## 2017-05-07 NOTE — Discharge Instructions (Signed)
You DID NOT HAVE ANY POLYPS. YOU HAVE DIVERTICULOSIS IN YOUR LEFT AND RIGHT COLON. You have MODERATE internal AND EXTERNAL hemorrhoids.   DRINK WATER TO KEEP URINE LIGHT YELLOW.  FOLLOW A HIGH FIBER DIET. AVOID ITEMS THAT CAUSE BLOATING & GAS. SEE INFO BELOW.  SEE SURGERY TO have your sigmoid colon removed DUE TO RECURRENT EPISODE OF DIVERTICULITIS.  Next colonoscopy in 5-10 years.   Colonoscopy Care After Read the instructions outlined below and refer to this sheet in the next week. These discharge instructions provide you with general information on caring for yourself after you leave the hospital. While your treatment has been planned according to the most current medical practices available, unavoidable complications occasionally occur. If you have any problems or questions after discharge, call DR. Laine Fonner, [phone].  ACTIVITY  You may resume your regular activity, but move at a slower pace for the next 24 hours.   Take frequent rest periods for the next 24 hours.   Walking will help get rid of the air and reduce the bloated feeling in your belly (abdomen).   No driving for 24 hours (because of the medicine (anesthesia) used during the test).   You may shower.   Do not sign any important legal documents or operate any machinery for 24 hours (because of the anesthesia used during the test).    NUTRITION  Drink plenty of fluids.   You may resume your normal diet as instructed by your doctor.   Begin with a light meal and progress to your normal diet. Heavy or fried foods are harder to digest and may make you feel sick to your stomach (nauseated).   Avoid alcoholic beverages for 24 hours or as instructed.    MEDICATIONS  You may resume your normal medications.   WHAT YOU CAN EXPECT TODAY  Some feelings of bloating in the abdomen.   Passage of more gas than usual.   Spotting of blood in your stool or on the toilet paper  .  IF YOU HAD POLYPS REMOVED DURING  THE COLONOSCOPY:  Eat a soft diet IF YOU HAVE NAUSEA, BLOATING, ABDOMINAL PAIN, OR VOMITING.    FINDING OUT THE RESULTS OF YOUR TEST Not all test results are available during your visit. DR. Oneida Alar WILL CALL YOU WITHIN 7 DAYS OF YOUR PROCEDUE WITH YOUR RESULTS. Do not assume everything is normal if you have not heard from DR. Kasai Beltran IN ONE WEEK, CALL HER OFFICE AT [phone].  SEEK IMMEDIATE MEDICAL ATTENTION AND CALL THE OFFICE: [phone] IF:  You have more than a spotting of blood in your stool.   Your belly is swollen (abdominal distention).   You are nauseated or vomiting.   You have a temperature over 101F.   You have abdominal pain or discomfort that is severe or gets worse throughout the day.    High-Fiber Diet A high-fiber diet changes your normal diet to include more whole grains, legumes, fruits, and vegetables. Changes in the diet involve replacing refined carbohydrates with unrefined foods. The calorie level of the diet is essentially unchanged. The Dietary Reference Intake (recommended amount) for adult males is 38 grams per day. For adult females, it is 25 grams per day. Pregnant and lactating women should consume 28 grams of fiber per day. Fiber is the intact part of a plant that is not broken down during digestion. Functional fiber is fiber that has been isolated from the plant to provide a beneficial effect in the body. PURPOSE  Increase stool bulk.  Ease and regulate bowel movements.   Lower cholesterol.   REDUCE RISK OF COLON CANCER  INDICATIONS THAT YOU NEED MORE FIBER  Constipation and hemorrhoids.   Uncomplicated diverticulosis (intestine condition) and irritable bowel syndrome.   Weight management.   As a protective measure against hardening of the arteries (atherosclerosis), diabetes, and cancer.   GUIDELINES FOR INCREASING FIBER IN THE DIET  Start adding fiber to the diet slowly. A gradual increase of about 5 more grams (2 slices of  whole-wheat bread, 2 servings of most fruits or vegetables, or 1 bowl of high-fiber cereal) per day is best. Too rapid an increase in fiber may result in constipation, flatulence, and bloating.   Drink enough water and fluids to keep your urine clear or pale yellow. Water, juice, or caffeine-free drinks are recommended. Not drinking enough fluid may cause constipation.   Eat a variety of high-fiber foods rather than one type of fiber.   Try to increase your intake of fiber through using high-fiber foods rather than fiber pills or supplements that contain small amounts of fiber.   The goal is to change the types of food eaten. Do not supplement your present diet with high-fiber foods, but replace foods in your present diet.   INCLUDE A VARIETY OF FIBER SOURCES  Replace refined and processed grains with whole grains, canned fruits with fresh fruits, and incorporate other fiber sources. White rice, white breads, and most bakery goods contain little or no fiber.   Brown whole-grain rice, buckwheat oats, and many fruits and vegetables are all good sources of fiber. These include: broccoli, Brussels sprouts, cabbage, cauliflower, beets, sweet potatoes, white potatoes (skin on), carrots, tomatoes, eggplant, squash, berries, fresh fruits, and dried fruits.   Cereals appear to be the richest source of fiber. Cereal fiber is found in whole grains and bran. Bran is the fiber-rich outer coat of cereal grain, which is largely removed in refining. In whole-grain cereals, the bran remains. In breakfast cereals, the largest amount of fiber is found in those with "bran" in their names. The fiber content is sometimes indicated on the label.   You may need to include additional fruits and vegetables each day.   In baking, for 1 cup white flour, you may use the following substitutions:   1 cup whole-wheat flour minus 2 tablespoons.   1/2 cup white flour plus 1/2 cup whole-wheat flour.    Diverticulosis Diverticulosis is a common condition that develops when small pouches (diverticula) form in the wall of the colon. The risk of diverticulosis increases with age. It happens more often in people who eat a low-fiber diet. Most individuals with diverticulosis have no symptoms. Those individuals with symptoms usually experience belly (abdominal) pain, constipation, or loose stools (diarrhea).  HOME CARE INSTRUCTIONS  Increase the amount of fiber in your diet as directed by your caregiver or dietician. This may reduce symptoms of diverticulosis.   Drink at least 6 to 8 glasses of water each day to prevent constipation.   Try not to strain when you have a bowel movement.   THERE IS NO NEED TO Avoid nuts and seeds to prevent complications.   FOODS HAVING HIGH FIBER CONTENT INCLUDE:  Fruits. Apple, peach, pear, tangerine, raisins, prunes.   Vegetables. Brussels sprouts, asparagus, broccoli, cabbage, carrot, cauliflower, romaine lettuce, spinach, summer squash, tomato, winter squash, zucchini.   Starchy Vegetables. Baked beans, kidney beans, lima beans, split peas, lentils, potatoes (with skin).   Grains. Whole wheat bread, brown rice, bran flake  cereal, plain oatmeal, white rice, shredded wheat, bran muffins.    Hemorrhoids Hemorrhoids are dilated (enlarged) veins around the rectum. Sometimes clots will form in the veins. This makes them swollen and painful. These are called thrombosed hemorrhoids. Causes of hemorrhoids include:  Constipation.   Straining to have a bowel movement.   HEAVY LIFTING  HOME CARE INSTRUCTIONS  Eat a well balanced diet and drink 6 to 8 glasses of water every day to avoid constipation. You may also use a bulk laxative.   Avoid straining to have bowel movements.   Keep anal area dry and clean.   Do not use a donut shaped pillow or sit on the toilet for long periods. This increases blood pooling and pain.   Move your bowels when your body  has the urge; this will require less straining and will decrease pain and pressure.

## 2017-05-07 NOTE — Op Note (Signed)
[facility] Patient Name: Denise Taylor Procedure Date: 05/07/2017 10:14 AM MRN: [id number] Date of Birth: Jan 06, 1961 Attending MD: Barney Drain MD, MD CSN: [id number] Age: 56 Admit Type: Outpatient Procedure:                Colonoscopy, SURVEILLANCE Indications:              Personal history of colonic polyps IN 2014 Providers:                Barney Drain MD, MD, Janeece Riggers, RN, Hinton Rao, RN Referring MD:             Crissie Sickles. Howard Medicines:                Meperidine 100 mg IV, Midazolam 5 mg IV Complications:            No immediate complications. Estimated Blood Loss:     Estimated blood loss: none. Procedure:                Pre-Anesthesia Assessment:                           - Prior to the procedure, a History and Physical                            was performed, and patient medications and                            allergies were reviewed. The patient's tolerance of                            previous anesthesia was also reviewed. The risks                            and benefits of the procedure and the sedation                            options and risks were discussed with the patient.                            All questions were answered, and informed consent                            was obtained. Prior Anticoagulants: The patient has                            taken no previous anticoagulant or antiplatelet                            agents. ASA Grade Assessment: II - A patient with                            mild systemic disease. After reviewing the risks  and benefits, the patient was deemed in                            satisfactory condition to undergo the procedure.                            After obtaining informed consent, the colonoscope                            was passed under direct vision. Throughout the                            procedure, the patient's blood pressure, pulse, and                          oxygen saturations were monitored continuously. The                            EC-[id number] ([id number]) scope was introduced through                            the anus and advanced to the the cecum, identified                            by appendiceal orifice and ileocecal valve. The                            colonoscopy was technically difficult and complex                            due to restricted mobility of the colon and                            significant looping. Successful completion of the                            procedure was aided by increasing the dose of                            sedation medication, withdrawing the scope and                            replacing with the pediatric colonoscope,                            straightening and shortening the scope to obtain                            bowel loop reduction and COLOWRAP. The patient                            tolerated the procedure fairly well. The quality of  the bowel preparation was excellent. The ileocecal                            valve, appendiceal orifice, and rectum were                            photographed. Scope In: Scope Out: 11:15:21 AM Scope Withdrawal Time: 0 hours 11 minutes 28 seconds  Findings:      Multiple small and large-mouthed diverticula were found in the       recto-sigmoid colon, sigmoid colon, descending colon and transverse       colon.      The recto-sigmoid colon and sigmoid colon revealed significantly       excessive looping.      External and internal hemorrhoids were found during retroflexion. The       hemorrhoids were moderate. Impression:               - Diverticulosis in the recto-sigmoid colon, in the                            sigmoid colon, in the descending colon and in the                            transverse colon.                           - There was significant looping of the colon.                           -  External and internal hemorrhoids. Moderate Sedation:      Moderate (conscious) sedation was administered by the endoscopy nurse       and supervised by the endoscopist. The following parameters were       monitored: oxygen saturation, heart rate, blood pressure, and response       to care. Total physician intraservice time was 37 minutes. Recommendation:           - Repeat colonoscopy in 5-10 years for                            surveillance. SEE SURGERY FOR SIGMOID RESECTION DUE                            TO MULTIPLE EPISODE OF DIVERTICULITIS.                           - High fiber diet.                           - Continue present medications.                           - Patient has a contact number available for                            emergencies. The signs and symptoms of potential  delayed complications were discussed with the                            patient. Return to normal activities tomorrow.                            Written discharge instructions were provided to the                            patient. Procedure Code(s):        --- Professional ---                           [phone], Colonoscopy, flexible; diagnostic, including                            collection of specimen(s) by brushing or washing,                            when performed (separate procedure)                           99152, Moderate sedation services provided by the                            same physician or other qualified health care                            professional performing the diagnostic or                            therapeutic service that the sedation supports,                            requiring the presence of an independent trained                            observer to assist in the monitoring of the                            patient's level of consciousness and physiological                            status; initial 15 minutes of intraservice  time,                            patient age 23 years or older                           [id number], Moderate sedation services; each additional                            15 minutes intraservice time Diagnosis Code(s):        --- Professional ---  K64.8, Other hemorrhoids                           Z86.010, Personal history of colonic polyps                           K57.30, Diverticulosis of large intestine without                            perforation or abscess without bleeding CPT copyright 2016 [employer]. All rights reserved. The codes documented in this report are preliminary and upon coder review may  be revised to meet current compliance requirements. Barney Drain, MD Barney Drain MD, MD 05/07/2017 11:22:36 AM This report has been signed electronically. Number of Addenda: 0

## 2017-05-09 ENCOUNTER — Encounter (HOSPITAL_COMMUNITY): Payer: Self-pay | Admitting: Gastroenterology

## 2017-05-17 ENCOUNTER — Ambulatory Visit (INDEPENDENT_AMBULATORY_CARE_PROVIDER_SITE_OTHER): Payer: 59 | Admitting: General Surgery

## 2017-05-17 ENCOUNTER — Encounter: Payer: Self-pay | Admitting: General Surgery

## 2017-05-17 VITALS — BP 132/78 | HR 68 | Temp 98.2°F | Ht 63.0 in | Wt 182.0 lb

## 2017-05-17 DIAGNOSIS — K5732 Diverticulitis of large intestine without perforation or abscess without bleeding: Secondary | ICD-10-CM | POA: Diagnosis not present

## 2017-05-17 NOTE — Progress Notes (Signed)
Denise Taylor; [id number]; 12-Jul-1960   HPI Patient is a 56 year old white female who was referred by care by gastroenterology as well as Dr. Rory Percy for evaluation and treatment of diverticulitis. She has had 4 episodes over the past 2 years.  Her latest episode was 1 month ago.  She was treated with oral antibiotics.  She usually has pain in the left lower quadrant.  She currently has no pain.  She underwent colonoscopy in the past which revealed left-sided diverticulosis.  She also had narrowing in the sigmoid colon region. Past Medical History:  Diagnosis Date  . Current use of estrogen therapy 02/13/2014  . Diverticulitis    4 episodes in the past 1-2 years.  . Endometriosis   . Indigestion 02/13/2014    Past Surgical History:  Procedure Laterality Date  . ABDOMINAL HYSTERECTOMY     partial  . BREAST REDUCTION SURGERY    . CHOLECYSTECTOMY  1999  . COLONOSCOPY  07/05/2012   Procedure: COLONOSCOPY;  Surgeon: Danie Binder, MD;  Location: [facility];  Service: Endoscopy;  Laterality: N/A;  9:30  . COLONOSCOPY N/A 05/07/2017   Procedure: COLONOSCOPY;  Surgeon: Danie Binder, MD;  Location: [facility];  Service: Endoscopy;  Laterality: N/A;  1:45pm  . endometrial mass    . ESOPHAGOGASTRODUODENOSCOPY  05/10/11   Benson: mild narrowing GE junction, Savory dilation  . FOOT SURGERY     left  . SALPINGOOPHORECTOMY     right  . SKIN SURGERY     back of left leg  . TONSILLECTOMY      Family History  Problem Relation Age of Onset  . COPD Mother   . Hypertension Father   . Other Brother        paralyzed from waist down; fell out of deer stand  . Other Daughter        born with 2 holes in heart  . Heart disease Maternal Grandfather   . COPD Maternal Grandfather   . Stroke Paternal Grandmother   . Stroke Paternal Grandfather   . Colon cancer Neg Hx     Current Outpatient Medications on File Prior to Visit  Medication Sig Dispense Refill  . ciprofloxacin (CIPRO) 500  MG tablet Take 1 tablet (500 mg total) by mouth every 12 (twelve) hours. 20 tablet 0  . EPIPEN 2-PAK 0.3 MG/0.3ML SOAJ injection Inject 0.3 mg into the muscle as needed (bee stings).     Marland Kitchen estradiol (VIVELLE-DOT) 0.0375 MG/24HR APPLY 1 PATCH TWO TIMES A WEEK 24 patch 4  . ibuprofen (ADVIL,MOTRIN) 800 MG tablet Take 1 tablet (800 mg total) by mouth 3 (three) times daily. (Patient taking differently: Take 800 mg by mouth 3 (three) times daily as needed (migraines). ) 21 tablet 0  . metroNIDAZOLE (FLAGYL) 500 MG tablet Take 1 tablet (500 mg total) by mouth 2 (two) times daily. 20 tablet 0  . omeprazole (PRILOSEC) 20 MG capsule Take 1 capsule (20 mg total) by mouth daily as needed. Indigestion. (Patient taking differently: Take 20 mg by mouth daily as needed (indigestion). Indigestion.) 30 capsule 11  . ondansetron (ZOFRAN-ODT) 4 MG disintegrating tablet Take 4 mg by mouth daily as needed for nausea or vomiting.     No current facility-administered medications on file prior to visit.     Allergies  Allergen Reactions  . Bee Venom Anaphylaxis  . Codeine Nausea Only    Social History   Substance and Sexual Activity  Alcohol Use Yes   Comment:  once a week    Social History   Tobacco Use  Smoking Status Never Smoker  Smokeless Tobacco Never Used    Review of Systems  Constitutional: Negative.   HENT: Negative.   Eyes: Negative.   Respiratory: Negative.   Cardiovascular: Negative.   Gastrointestinal: Positive for abdominal pain.  Genitourinary: Negative.   Musculoskeletal: Negative.   Skin: Negative.   Neurological: Negative.   Endo/Heme/Allergies: Negative.   Psychiatric/Behavioral: Negative.     Objective   Vitals:   05/17/17 1008  BP: 132/78  Pulse: 68  Temp: 98.2 F (36.8 C)    Physical Exam  Constitutional: She is oriented to person, place, and time and well-developed, well-nourished, and in no distress.  HENT:  Head: Normocephalic and atraumatic.   Cardiovascular: Normal rate, regular rhythm and normal heart sounds. Exam reveals no friction rub.  No murmur heard. Pulmonary/Chest: Effort normal and breath sounds normal. No respiratory distress. She has no wheezes. She has no rales.  Abdominal: Soft. Bowel sounds are normal. She exhibits no distension and no mass. There is tenderness. There is no rebound and no guarding.  Slight tenderness to deep palpation in left lower quadrant.  Neurological: She is alert and oriented to person, place, and time.  Skin: Skin is warm and dry.  Vitals reviewed.  Colonoscopy report reviewed. Assessment    Diverticulosis, history of sigmoid diverticulitis Plan    due to the recurrent nature of her diverticulitis, patient is a candidate for a partial colectomy, primarily involving the sigmoid colon.  The risks and benefits of the procedure including bleeding, infection, and the possibility of recurrence of the diverticulitis.  He explained to the patient, who gave informed consent.  She will return in 1 month to discuss surgery further and to schedule surgery.

## 2017-05-17 NOTE — Patient Instructions (Signed)
Open Colectomy An open colectomy is surgery to remove part or all of the large intestine (colon). This procedure may be used to treat several conditions, including:  Inflammation and infection of the colon (diverticulitis).  Tumors or masses in the colon.  Inflammatory bowel disease, such as Crohn disease or ulcerative colitis.  Bleeding from the colon.  Blockage or obstruction of the colon.  Tell a health care provider about:  Any allergies you have.  All medicines you are taking, including vitamins, herbs, eye drops, creams, and over-the-counter medicines.  Any problems you or family members have had with anesthetic medicines.  Any blood disorders you have.  Any surgeries you have had.  Any medical conditions you have.  Whether you are pregnant or may be pregnant.  Whether you smoke or use tobacco products. These can affect your body's reaction to anesthesia. What are the risks? Generally, this is a safe procedure. However, problems may occur, including:  Infection.  Bleeding.  Allergic reactions to medicines.  Damage to other structures or organs.  Pneumonia.  The incision opening up.  Tissues from inside the abdomen bulging through the incision (hernia).  Reopening of the colon where it was stitched or stapled together.  A blood clot forming in a vein and traveling to the lungs.  Future blockage of the small intestine from scar tissue.  What happens before the procedure? Staying hydrated Follow instructions from your health care provider about hydration, which may include:  Up to 2 hours before the procedure - you may continue to drink clear liquids, such as water, clear fruit juice, black coffee, and plain tea.  Eating and drinking restrictions Follow instructions from your health care provider about eating and drinking, which may include:  8 hours before the procedure - stop eating heavy meals or foods such as meat, fried foods, or fatty foods.  6  hours before the procedure - stop eating light meals or foods, such as toast or cereal.  6 hours before the procedure - stop drinking milk or drinks that contain milk.  2 hours before the procedure - stop drinking clear liquids.  Bowel prep In some cases, you may be prescribed an oral bowel prep to clean out your colon. If so:  Take it as told by your health care provider. Starting the day before your procedure, you may need to drink a large amount of medicated liquid. The liquid will cause you to have multiple loose stools until your stool is almost clear or light green.  Follow instructions from your health care provider about eating and drinking restrictions during bowel prep.  Medicines  Ask your health care provider about: ? Changing or stopping your regular medicines or vitamins. This is especially important if you are taking diabetes medicines, blood thinners, or vitamin E. ? Taking medicines such as aspirin and ibuprofen. These medicines can thin your blood. Do not take these medicines before your procedure if your health care provider instructs you not to.  If you were prescribed an antibiotic medicine, take it as told by your health care provider. General instructions  Bring loose-fitting, comfortable clothing and slip-on shoes that you can put on without bending over.  Make sure to see your health care provider for any tests that you need before the procedure, such as: ? Blood tests. ? A test to check the heart's rhythm (electrocardiogram, ECG). ? A CT scan of your abdomen. ? Urine tests. ? Colonoscopy.  Plan to have someone take you home from the   hospital or clinic.  Arrange for someone to help you with your activities during your recovery. What happens during the procedure?  To reduce your risk of infection: ? Your health care team will wash or sanitize their hands. ? Your skin will be washed with soap. ? Hair may be removed from the surgical area.  An IV tube  will be inserted into one of your veins. The tube will be used to give you medicines and fluids.  You will be given a medicine to make you fall asleep (general anesthetic). You may also be given a medicine to help you relax (sedative).  Small monitors will be connected to your body. They will be used to check your heart, blood pressure, and oxygen level.  A breathing tube may be placed into your lungs during the procedure.  A thin, flexible tube (catheter) will be placed into your bladder to drain urine.  A tube may be inserted through your nose and into your stomach (nasogastric tube, or NG tube). The tube is used to remove stomach fluids after surgery until the intestines start working again.  An incision will be made in your abdomen.  Clamps or staples will be put on your colon.  The part of the colon between the clamps or staples will be removed.  The ends of the colon that remain will be stitched or stapled together.  The incision in your abdomen will be closed with stitches (sutures) or staples.  The incision will be covered with a bandage (dressing).  A small opening (stoma) may be created in your lower abdomen. A removable, external pouch (ostomy pouch) will be attached to the stoma. This pouch will collect stool outside of your body. Stool passes through the stoma and into the pouch instead of through your anus. The procedure may vary among health care providers and hospitals. What happens after the procedure?  Your blood pressure, heart rate, breathing rate, and blood oxygen level will be monitored until the medicines you were given have worn off.  You may continue to receive fluids and medicines through an IV tube.  You will start on a clear liquid diet and gradually go back to a normal diet.  Do not drive until your health care provider approves.  You may have some pain in your abdomen. You will be given pain medicine to control the pain.  You will be encouraged to  do the following: ? Do breathing exercises to prevent pneumonia. ? Get up and start walking within a day after surgery. You should try to get up 5-6 times a day. This information is not intended to replace advice given to you by your health care provider. Make sure you discuss any questions you have with your health care provider. Document Released: 03/19/2009 Document Revised: 02/21/2016 Document Reviewed: 02/21/2016 Elsevier Interactive Patient Education  2018 [employer]  

## 2017-05-22 ENCOUNTER — Other Ambulatory Visit: Payer: Self-pay | Admitting: Adult Health

## 2017-06-19 ENCOUNTER — Encounter: Payer: Self-pay | Admitting: General Surgery

## 2017-06-19 ENCOUNTER — Ambulatory Visit (INDEPENDENT_AMBULATORY_CARE_PROVIDER_SITE_OTHER): Payer: 59 | Admitting: General Surgery

## 2017-06-19 VITALS — BP 134/71 | HR 72 | Temp 97.8°F | Ht 63.0 in | Wt 186.0 lb

## 2017-06-19 DIAGNOSIS — K5732 Diverticulitis of large intestine without perforation or abscess without bleeding: Secondary | ICD-10-CM

## 2017-06-19 MED ORDER — NEOMYCIN SULFATE 500 MG PO TABS
1000.0000 mg | ORAL_TABLET | Freq: Three times a day (TID) | ORAL | 0 refills | Status: DC
Start: 2017-06-19 — End: 2017-07-04

## 2017-06-19 MED ORDER — METRONIDAZOLE 250 MG PO TABS: 250.0000 mg | ORAL_TABLET | Freq: Three times a day (TID) | ORAL | 0 refills | Status: DC

## 2017-06-19 MED ORDER — PEG 3350-KCL-NABCB-NACL-NASULF 236 G PO SOLR: 4000.0000 mL | Freq: Once | ORAL | 0 refills | Status: AC

## 2017-06-19 NOTE — Patient Instructions (Signed)
Open Colectomy An open colectomy is surgery to remove part or all of the large intestine (colon). This procedure may be used to treat several conditions, including:  Inflammation and infection of the colon (diverticulitis).  Tumors or masses in the colon.  Inflammatory bowel disease, such as Crohn disease or ulcerative colitis.  Bleeding from the colon.  Blockage or obstruction of the colon.  Tell a health care provider about:  Any allergies you have.  All medicines you are taking, including vitamins, herbs, eye drops, creams, and over-the-counter medicines.  Any problems you or family members have had with anesthetic medicines.  Any blood disorders you have.  Any surgeries you have had.  Any medical conditions you have.  Whether you are pregnant or may be pregnant.  Whether you smoke or use tobacco products. These can affect your body's reaction to anesthesia. What are the risks? Generally, this is a safe procedure. However, problems may occur, including:  Infection.  Bleeding.  Allergic reactions to medicines.  Damage to other structures or organs.  Pneumonia.  The incision opening up.  Tissues from inside the abdomen bulging through the incision (hernia).  Reopening of the colon where it was stitched or stapled together.  A blood clot forming in a vein and traveling to the lungs.  Future blockage of the small intestine from scar tissue.  What happens before the procedure? Staying hydrated Follow instructions from your health care provider about hydration, which may include:  Up to 2 hours before the procedure - you may continue to drink clear liquids, such as water, clear fruit juice, black coffee, and plain tea.  Eating and drinking restrictions Follow instructions from your health care provider about eating and drinking, which may include:  8 hours before the procedure - stop eating heavy meals or foods such as meat, fried foods, or fatty foods.  6  hours before the procedure - stop eating light meals or foods, such as toast or cereal.  6 hours before the procedure - stop drinking milk or drinks that contain milk.  2 hours before the procedure - stop drinking clear liquids.  Bowel prep In some cases, you may be prescribed an oral bowel prep to clean out your colon. If so:  Take it as told by your health care provider. Starting the day before your procedure, you may need to drink a large amount of medicated liquid. The liquid will cause you to have multiple loose stools until your stool is almost clear or light green.  Follow instructions from your health care provider about eating and drinking restrictions during bowel prep.  Medicines  Ask your health care provider about: ? Changing or stopping your regular medicines or vitamins. This is especially important if you are taking diabetes medicines, blood thinners, or vitamin E. ? Taking medicines such as aspirin and ibuprofen. These medicines can thin your blood. Do not take these medicines before your procedure if your health care provider instructs you not to.  If you were prescribed an antibiotic medicine, take it as told by your health care provider. General instructions  Bring loose-fitting, comfortable clothing and slip-on shoes that you can put on without bending over.  Make sure to see your health care provider for any tests that you need before the procedure, such as: ? Blood tests. ? A test to check the heart's rhythm (electrocardiogram, ECG). ? A CT scan of your abdomen. ? Urine tests. ? Colonoscopy.  Plan to have someone take you home from the   hospital or clinic.  Arrange for someone to help you with your activities during your recovery. What happens during the procedure?  To reduce your risk of infection: ? Your health care team will wash or sanitize their hands. ? Your skin will be washed with soap. ? Hair may be removed from the surgical area.  An IV tube  will be inserted into one of your veins. The tube will be used to give you medicines and fluids.  You will be given a medicine to make you fall asleep (general anesthetic). You may also be given a medicine to help you relax (sedative).  Small monitors will be connected to your body. They will be used to check your heart, blood pressure, and oxygen level.  A breathing tube may be placed into your lungs during the procedure.  A thin, flexible tube (catheter) will be placed into your bladder to drain urine.  A tube may be inserted through your nose and into your stomach (nasogastric tube, or NG tube). The tube is used to remove stomach fluids after surgery until the intestines start working again.  An incision will be made in your abdomen.  Clamps or staples will be put on your colon.  The part of the colon between the clamps or staples will be removed.  The ends of the colon that remain will be stitched or stapled together.  The incision in your abdomen will be closed with stitches (sutures) or staples.  The incision will be covered with a bandage (dressing).  A small opening (stoma) may be created in your lower abdomen. A removable, external pouch (ostomy pouch) will be attached to the stoma. This pouch will collect stool outside of your body. Stool passes through the stoma and into the pouch instead of through your anus. The procedure may vary among health care providers and hospitals. What happens after the procedure?  Your blood pressure, heart rate, breathing rate, and blood oxygen level will be monitored until the medicines you were given have worn off.  You may continue to receive fluids and medicines through an IV tube.  You will start on a clear liquid diet and gradually go back to a normal diet.  Do not drive until your health care provider approves.  You may have some pain in your abdomen. You will be given pain medicine to control the pain.  You will be encouraged to  do the following: ? Do breathing exercises to prevent pneumonia. ? Get up and start walking within a day after surgery. You should try to get up 5-6 times a day. This information is not intended to replace advice given to you by your health care provider. Make sure you discuss any questions you have with your health care provider. Document Released: 03/19/2009 Document Revised: 02/21/2016 Document Reviewed: 02/21/2016 Elsevier Interactive Patient Education  2018 [employer]  

## 2017-06-19 NOTE — H&P (Signed)
Denise Taylor; [id number]; Mar 23, 1961   HPI Patient is a 57 year old white female who was referred by care by gastroenterology as well as Dr. Rory Percy for evaluation and treatment of diverticulitis. She has had 4 episodes over the past 2 years.  Her latest episode was 1 month ago.  She was treated with oral antibiotics.  She usually has pain in the left lower quadrant.  She currently has no pain.  She underwent colonoscopy in the past which revealed left-sided diverticulosis.  She also had narrowing in the sigmoid colon region. Past Medical History:  Diagnosis Date  . Current use of estrogen therapy 02/13/2014  . Diverticulitis    4 episodes in the past 1-2 years.  . Endometriosis   . Indigestion 02/13/2014    Past Surgical History:  Procedure Laterality Date  . ABDOMINAL HYSTERECTOMY     partial  . BREAST REDUCTION SURGERY    . CHOLECYSTECTOMY  1999  . COLONOSCOPY  07/05/2012   Procedure: COLONOSCOPY;  Surgeon: Danie Binder, MD;  Location: [facility];  Service: Endoscopy;  Laterality: N/A;  9:30  . COLONOSCOPY N/A 05/07/2017   Procedure: COLONOSCOPY;  Surgeon: Danie Binder, MD;  Location: [facility];  Service: Endoscopy;  Laterality: N/A;  1:45pm  . endometrial mass    . ESOPHAGOGASTRODUODENOSCOPY  05/10/11   Benson: mild narrowing GE junction, Savory dilation  . FOOT SURGERY     left  . SALPINGOOPHORECTOMY     right  . SKIN SURGERY     back of left leg  . TONSILLECTOMY      Family History  Problem Relation Age of Onset  . COPD Mother   . Hypertension Father   . Other Brother        paralyzed from waist down; fell out of deer stand  . Other Daughter        born with 2 holes in heart  . Heart disease Maternal Grandfather   . COPD Maternal Grandfather   . Stroke Paternal Grandmother   . Stroke Paternal Grandfather   . Colon cancer Neg Hx     Current Outpatient Medications on File Prior to Visit  Medication Sig Dispense Refill  . ciprofloxacin (CIPRO) 500  MG tablet Take 1 tablet (500 mg total) by mouth every 12 (twelve) hours. 20 tablet 0  . EPIPEN 2-PAK 0.3 MG/0.3ML SOAJ injection Inject 0.3 mg into the muscle as needed (bee stings).     Marland Kitchen estradiol (VIVELLE-DOT) 0.0375 MG/24HR APPLY 1 PATCH TWO TIMES A WEEK 24 patch 4  . ibuprofen (ADVIL,MOTRIN) 800 MG tablet Take 1 tablet (800 mg total) by mouth 3 (three) times daily. (Patient taking differently: Take 800 mg by mouth 3 (three) times daily as needed (migraines). ) 21 tablet 0  . metroNIDAZOLE (FLAGYL) 500 MG tablet Take 1 tablet (500 mg total) by mouth 2 (two) times daily. 20 tablet 0  . omeprazole (PRILOSEC) 20 MG capsule Take 1 capsule (20 mg total) by mouth daily as needed. Indigestion. (Patient taking differently: Take 20 mg by mouth daily as needed (indigestion). Indigestion.) 30 capsule 11  . ondansetron (ZOFRAN-ODT) 4 MG disintegrating tablet Take 4 mg by mouth daily as needed for nausea or vomiting.     No current facility-administered medications on file prior to visit.     Allergies  Allergen Reactions  . Bee Venom Anaphylaxis  . Codeine Nausea Only    Social History   Substance and Sexual Activity  Alcohol Use Yes   Comment:  once a week    Social History   Tobacco Use  Smoking Status Never Smoker  Smokeless Tobacco Never Used    Review of Systems  Constitutional: Negative.   HENT: Negative.   Eyes: Negative.   Respiratory: Negative.   Cardiovascular: Negative.   Gastrointestinal: Positive for abdominal pain.  Genitourinary: Negative.   Musculoskeletal: Negative.   Skin: Negative.   Neurological: Negative.   Endo/Heme/Allergies: Negative.   Psychiatric/Behavioral: Negative.     Objective   Vitals:   05/17/17 1008  BP: 132/78  Pulse: 68  Temp: 98.2 F (36.8 C)    Physical Exam  Constitutional: She is oriented to person, place, and time and well-developed, well-nourished, and in no distress.  HENT:  Head: Normocephalic and atraumatic.   Cardiovascular: Normal rate, regular rhythm and normal heart sounds. Exam reveals no friction rub.  No murmur heard. Pulmonary/Chest: Effort normal and breath sounds normal. No respiratory distress. She has no wheezes. She has no rales.  Abdominal: Soft. Bowel sounds are normal. She exhibits no distension and no mass. There is tenderness. There is no rebound and no guarding.  Slight tenderness to deep palpation in left lower quadrant.  Neurological: She is alert and oriented to person, place, and time.  Skin: Skin is warm and dry.  Vitals reviewed.  Colonoscopy report reviewed. Assessment    Diverticulosis, history of sigmoid diverticulitis Plan    due to the recurrent nature of her diverticulitis, patient is a candidate for a partial colectomy, primarily involving the sigmoid colon.  The risks and benefits of the procedure including bleeding, infection, and the possibility of recurrence of the diverticulitis.  He explained to the patient, who gave informed consent.   GoLYTELY, neomycin, and Flagyl have all been ordered preoperatively for bowel prep.

## 2017-06-19 NOTE — Progress Notes (Signed)
Subjective:     Denise Taylor    Patient here for follow-up of sigmoid diverticulitis.  She currently has no pain.  She would like to proceed with scheduling of elective partial colectomy. Objective:    BP 134/71   Pulse 72   Temp 97.8 F (36.6 C)   Ht 5\' 3"  (1.6 m)   Wt 186 lb (84.4 kg)   BMI 32.95 kg/m   General:  alert, cooperative and no distress    Abdomen is soft, nontender, nondistended.  No rigidity noted.  No masses noted.     Assessment:    Sigmoid diverticulitis, not currently active.    Plan:    Patient scheduled for partial colectomy on 07/02/2017.  The risks and benefits of the procedure including bleeding, infection, anastomotic leak, the possibility of recurrence of the diverticulitis were fully explained to the patient, who gave informed consent.  GoLYTELY, Flagyl, neomycin all been prescribed preoperatively.

## 2017-06-20 NOTE — Patient Instructions (Signed)
Denise Taylor  06/20/2017     @PREFPERIOPPHARMACY @   Your procedure is scheduled on  07/02/2017 .  Report to [facility] at  [email or web address].  Call this number if you have problems the morning of surgery:  [phone]   Remember:  Do not eat food or drink liquids after midnight.  Take these medicines the morning of surgery with A SIP OF WATER  Prilosec, zofran.   Do not wear jewelry, make-up or nail polish.  Do not wear lotions, powders, or perfumes, or deodorant.  Do not shave 48 hours prior to surgery.  Men may shave face and neck.  Do not bring valuables to the hospital.  [facility] is not responsible for any belongings or valuables.  Contacts, dentures or bridgework may not be worn into surgery.  Leave your suitcase in the car.  After surgery it may be brought to your room.  For patients admitted to the hospital, discharge time will be determined by your treatment team.  Patients discharged the day of surgery will not be allowed to drive home.   Name and phone number of your driver:   family Special instructions:  Follow any bowel prep given to you by Dr Arnoldo Morale.  Please read over the following fact sheets that you were given. Anesthesia Post-op Instructions and Care and Recovery After Surgery       Open Colectomy An open colectomy is surgery to remove part or all of the large intestine (colon). This procedure may be used to treat several conditions, including:  Inflammation and infection of the colon (diverticulitis).  Tumors or masses in the colon.  Inflammatory bowel disease, such as Crohn disease or ulcerative colitis.  Bleeding from the colon.  Blockage or obstruction of the colon.  Tell a health care provider about:  Any allergies you have.  All medicines you are taking, including vitamins, herbs, eye drops, creams, and over-the-counter medicines.  Any problems you or family members have had with anesthetic medicines.  Any blood  disorders you have.  Any surgeries you have had.  Any medical conditions you have.  Whether you are pregnant or may be pregnant.  Whether you smoke or use tobacco products. These can affect your body's reaction to anesthesia. What are the risks? Generally, this is a safe procedure. However, problems may occur, including:  Infection.  Bleeding.  Allergic reactions to medicines.  Damage to other structures or organs.  Pneumonia.  The incision opening up.  Tissues from inside the abdomen bulging through the incision (hernia).  Reopening of the colon where it was stitched or stapled together.  A blood clot forming in a vein and traveling to the lungs.  Future blockage of the small intestine from scar tissue.  What happens before the procedure? Staying hydrated Follow instructions from your health care provider about hydration, which may include:  Up to 2 hours before the procedure - you may continue to drink clear liquids, such as water, clear fruit juice, black coffee, and plain tea.  Eating and drinking restrictions Follow instructions from your health care provider about eating and drinking, which may include:  8 hours before the procedure - stop eating heavy meals or foods such as meat, fried foods, or fatty foods.  6 hours before the procedure - stop eating light meals or foods, such as toast or cereal.  6 hours before the procedure - stop drinking milk or drinks that contain milk.  2 hours before the procedure - stop drinking clear liquids.  Bowel prep In some cases, you may be prescribed an oral bowel prep to clean out your colon. If so:  Take it as told by your health care provider. Starting the day before your procedure, you may need to drink a large amount of medicated liquid. The liquid will cause you to have multiple loose stools until your stool is almost clear or light green.  Follow instructions from your health care provider about eating and drinking  restrictions during bowel prep.  Medicines  Ask your health care provider about: ? Changing or stopping your regular medicines or vitamins. This is especially important if you are taking diabetes medicines, blood thinners, or vitamin E. ? Taking medicines such as aspirin and ibuprofen. These medicines can thin your blood. Do not take these medicines before your procedure if your health care provider instructs you not to.  If you were prescribed an antibiotic medicine, take it as told by your health care provider. General instructions  Bring loose-fitting, comfortable clothing and slip-on shoes that you can put on without bending over.  Make sure to see your health care provider for any tests that you need before the procedure, such as: ? Blood tests. ? A test to check the heart's rhythm (electrocardiogram, ECG). ? A CT scan of your abdomen. ? Urine tests. ? Colonoscopy.  Plan to have someone take you home from the hospital or clinic.  Arrange for someone to help you with your activities during your recovery. What happens during the procedure?  To reduce your risk of infection: ? Your health care team will wash or sanitize their hands. ? Your skin will be washed with soap. ? Hair may be removed from the surgical area.  An IV tube will be inserted into one of your veins. The tube will be used to give you medicines and fluids.  You will be given a medicine to make you fall asleep (general anesthetic). You may also be given a medicine to help you relax (sedative).  Small monitors will be connected to your body. They will be used to check your heart, blood pressure, and oxygen level.  A breathing tube may be placed into your lungs during the procedure.  A thin, flexible tube (catheter) will be placed into your bladder to drain urine.  A tube may be inserted through your nose and into your stomach (nasogastric tube, or NG tube). The tube is used to remove stomach fluids after  surgery until the intestines start working again.  An incision will be made in your abdomen.  Clamps or staples will be put on your colon.  The part of the colon between the clamps or staples will be removed.  The ends of the colon that remain will be stitched or stapled together.  The incision in your abdomen will be closed with stitches (sutures) or staples.  The incision will be covered with a bandage (dressing).  A small opening (stoma) may be created in your lower abdomen. A removable, external pouch (ostomy pouch) will be attached to the stoma. This pouch will collect stool outside of your body. Stool passes through the stoma and into the pouch instead of through your anus. The procedure may vary among health care providers and hospitals. What happens after the procedure?  Your blood pressure, heart rate, breathing rate, and blood oxygen level will be monitored until the medicines you were given have worn off.  You may continue to  receive fluids and medicines through an IV tube.  You will start on a clear liquid diet and gradually go back to a normal diet.  Do not drive until your health care provider approves.  You may have some pain in your abdomen. You will be given pain medicine to control the pain.  You will be encouraged to do the following: ? Do breathing exercises to prevent pneumonia. ? Get up and start walking within a day after surgery. You should try to get up 5-6 times a day. This information is not intended to replace advice given to you by your health care provider. Make sure you discuss any questions you have with your health care provider. Document Released: 03/19/2009 Document Revised: 02/21/2016 Document Reviewed: 02/21/2016 Elsevier Interactive Patient Education  2018 [employer].  Open Colectomy, Care After This sheet gives you information about how to care for yourself after your procedure. Your health care provider may also give you more specific  instructions. If you have problems or questions, contact your health care provider. What can I expect after the procedure? After the procedure, it is common to have:  Pain in your abdomen, especially along your incision.  Tiredness. Your energy level will return to normal over the next several weeks.  Constipation.  Nausea.  Difficulty urinating.  Follow these instructions at home: Activity  You may be able to return to most of your normal activities within 1-2 weeks, such as working, walking up stairs, and sexual activity.  Avoid activities that require a lot of energy for 4-6 weeks after surgery, such as running, climbing, and lifting heavy objects. Ask your health care provider what activities are safe for you.  Take rest breaks during the day as needed.  Do not drive for 1-2 weeks or until your health care provider says that it is safe.  Do not drive or use heavy machinery while taking prescription pain medicines.  Do not lift anything that is heavier than 10 lb (4.3 kg) until your health care provider says that it is safe. Incision care  Follow instructions from your health care provider about how to take care of your incision. Make sure you: ? Wash your hands with soap and water before you change your bandage (dressing). If soap and water are not available, use hand sanitizer. ? Change your dressing as told by your health care provider. ? Leave stitches (sutures) or staples in place. These skin closures may need to stay in place for 2 weeks or longer.  Avoid wearing tight clothing around your incision.  Protect your incision area from the sun.  Check your incision area every day for signs of infection. Check for: ? More redness, swelling, or pain. ? More fluid or blood. ? Warmth. ? Pus or a bad smell. General instructions  Do not take baths, swim, or use a hot tub until your health care provider approves. Ask your health care provider when you may shower.  Take  over-the-counter and prescription medicines, including stool softeners, only as told by your health care provider.  Eat a low-fat and low-fiber diet for the first 4 weeks after surgery.  Keep all follow-up visits as told by your health care provider. This is important. Contact a health care provider if:  You have more redness, swelling, or pain around your incision.  You have more fluid or blood coming from your incision.  Your incision feels warm to the touch.  You have pus or a bad smell coming from your  incision.  You have a fever or chills.  You do not have a bowel movement 2-3 days after surgery.  You cannot eat or drink for 24 hours or more.  You have persistent nausea and vomiting.  You have abdominal pain that gets worse and does not get better with medicine. Get help right away if:  You have chest pain.  You have shortness of breath.  You have pain or swelling in your legs.  Your incision breaks open after your sutures or staples have been removed.  You have bleeding from the rectum. This information is not intended to replace advice given to you by your health care provider. Make sure you discuss any questions you have with your health care provider. Document Released: 12/13/2010 Document Revised: 02/21/2016 Document Reviewed: 02/21/2016 Elsevier Interactive Patient Education  2018 [place] Anesthesia, Adult General anesthesia is the use of medicines to make a person "go to sleep" (be unconscious) for a medical procedure. General anesthesia is often recommended when a procedure:  Is long.  Requires you to be still or in an unusual position.  Is major and can cause you to lose blood.  Is impossible to do without general anesthesia.  The medicines used for general anesthesia are called general anesthetics. In addition to making you sleep, the medicines:  Prevent pain.  Control your blood pressure.  Relax your muscles.  Tell a health  care provider about:  Any allergies you have.  All medicines you are taking, including vitamins, herbs, eye drops, creams, and over-the-counter medicines.  Any problems you or family members have had with anesthetic medicines.  Types of anesthetics you have had in the past.  Any bleeding disorders you have.  Any surgeries you have had.  Any medical conditions you have.  Any history of heart or lung conditions, such as heart failure, sleep apnea, or chronic obstructive pulmonary disease (COPD).  Whether you are pregnant or may be pregnant.  Whether you use tobacco, alcohol, marijuana, or street drugs.  Any history of Armed forces logistics/support/administrative officer.  Any history of depression or anxiety. What are the risks? Generally, this is a safe procedure. However, problems may occur, including:  Allergic reaction to anesthetics.  Lung and heart problems.  Inhaling food or liquids from your stomach into your lungs (aspiration).  Injury to nerves.  Waking up during your procedure and being unable to move (rare).  Extreme agitation or a state of mental confusion (delirium) when you wake up from the anesthetic.  Air in the bloodstream, which can lead to stroke.  These problems are more likely to develop if you are having a major surgery or if you have an advanced medical condition. You can prevent some of these complications by answering all of your health care provider's questions thoroughly and by following all pre-procedure instructions. General anesthesia can cause side effects, including:  Nausea or vomiting  A sore throat from the breathing tube.  Feeling cold or shivery.  Feeling tired, washed out, or achy.  Sleepiness or drowsiness.  Confusion or agitation.  What happens before the procedure? Staying hydrated Follow instructions from your health care provider about hydration, which may include:  Up to 2 hours before the procedure - you may continue to drink clear liquids, such as  water, clear fruit juice, black coffee, and plain tea.  Eating and drinking restrictions Follow instructions from your health care provider about eating and drinking, which may include:  8 hours before the procedure - stop eating  heavy meals or foods such as meat, fried foods, or fatty foods.  6 hours before the procedure - stop eating light meals or foods, such as toast or cereal.  6 hours before the procedure - stop drinking milk or drinks that contain milk.  2 hours before the procedure - stop drinking clear liquids.  Medicines  Ask your health care provider about: ? Changing or stopping your regular medicines. This is especially important if you are taking diabetes medicines or blood thinners. ? Taking medicines such as aspirin and ibuprofen. These medicines can thin your blood. Do not take these medicines before your procedure if your health care provider instructs you not to. ? Taking new dietary supplements or medicines. Do not take these during the week before your procedure unless your health care provider approves them.  If you are told to take a medicine or to continue taking a medicine on the day of the procedure, take the medicine with sips of water. General instructions   Ask if you will be going home the same day, the following day, or after a longer hospital stay. ? Plan to have someone take you home. ? Plan to have someone stay with you for the first 24 hours after you leave the hospital or clinic.  For 3-6 weeks before the procedure, try not to use any tobacco products, such as cigarettes, chewing tobacco, and e-cigarettes.  You may brush your teeth on the morning of the procedure, but make sure to spit out the toothpaste. What happens during the procedure?  You will be given anesthetics through a mask and through an IV tube in one of your veins.  You may receive medicine to help you relax (sedative).  As soon as you are asleep, a breathing tube may be used to  help you breathe.  An anesthesia specialist will stay with you throughout the procedure. He or she will help keep you comfortable and safe by continuing to give you medicines and adjusting the amount of medicine that you get. He or she will also watch your blood pressure, pulse, and oxygen levels to make sure that the anesthetics do not cause any problems.  If a breathing tube was used to help you breathe, it will be removed before you wake up. The procedure may vary among health care providers and hospitals. What happens after the procedure?  You will wake up, often slowly, after the procedure is complete, usually in a recovery area.  Your blood pressure, heart rate, breathing rate, and blood oxygen level will be monitored until the medicines you were given have worn off.  You may be given medicine to help you calm down if you feel anxious or agitated.  If you will be going home the same day, your health care provider may check to make sure you can stand, drink, and urinate.  Your health care providers will treat your pain and side effects before you go home.  Do not drive for 24 hours if you received a sedative.  You may: ? Feel nauseous and vomit. ? Have a sore throat. ? Have mental slowness. ? Feel cold or shivery. ? Feel sleepy. ? Feel tired. ? Feel sore or achy, even in parts of your body where you did not have surgery. This information is not intended to replace advice given to you by your health care provider. Make sure you discuss any questions you have with your health care provider. Document Released: 08/29/2007 Document Revised: 11/02/2015 Document Reviewed: 05/06/2015  Elsevier Interactive Patient Education  2018 [place] Anesthesia, Adult, Care After These instructions provide you with information about caring for yourself after your procedure. Your health care provider may also give you more specific instructions. Your treatment has been planned according  to current medical practices, but problems sometimes occur. Call your health care provider if you have any problems or questions after your procedure. What can I expect after the procedure? After the procedure, it is common to have:  Vomiting.  A sore throat.  Mental slowness.  It is common to feel:  Nauseous.  Cold or shivery.  Sleepy.  Tired.  Sore or achy, even in parts of your body where you did not have surgery.  Follow these instructions at home: For at least 24 hours after the procedure:  Do not: ? Participate in activities where you could fall or become injured. ? Drive. ? Use heavy machinery. ? Drink alcohol. ? Take sleeping pills or medicines that cause drowsiness. ? Make important decisions or sign legal documents. ? Take care of children on your own.  Rest. Eating and drinking  If you vomit, drink water, juice, or soup when you can drink without vomiting.  Drink enough fluid to keep your urine clear or pale yellow.  Make sure you have little or no nausea before eating solid foods.  Follow the diet recommended by your health care provider. General instructions  Have a responsible adult stay with you until you are awake and alert.  Return to your normal activities as told by your health care provider. Ask your health care provider what activities are safe for you.  Take over-the-counter and prescription medicines only as told by your health care provider.  If you smoke, do not smoke without supervision.  Keep all follow-up visits as told by your health care provider. This is important. Contact a health care provider if:  You continue to have nausea or vomiting at home, and medicines are not helpful.  You cannot drink fluids or start eating again.  You cannot urinate after 8-12 hours.  You develop a skin rash.  You have fever.  You have increasing redness at the site of your procedure. Get help right away if:  You have difficulty  breathing.  You have chest pain.  You have unexpected bleeding.  You feel that you are having a life-threatening or urgent problem. This information is not intended to replace advice given to you by your health care provider. Make sure you discuss any questions you have with your health care provider. Document Released: 08/28/2000 Document Revised: 10/25/2015 Document Reviewed: 05/06/2015 Elsevier Interactive Patient Education  [employer].

## 2017-06-26 ENCOUNTER — Other Ambulatory Visit: Payer: Self-pay

## 2017-06-26 ENCOUNTER — Encounter (HOSPITAL_COMMUNITY)
Admission: RE | Admit: 2017-06-26 | Discharge: 2017-06-26 | Disposition: A | Discharge status: Home / Self Care | Payer: 59 | Source: Ambulatory Visit | Attending: General Surgery | Admitting: General Surgery

## 2017-06-26 ENCOUNTER — Encounter (HOSPITAL_COMMUNITY): Payer: Self-pay

## 2017-06-26 DIAGNOSIS — K5732 Diverticulitis of large intestine without perforation or abscess without bleeding: Secondary | ICD-10-CM | POA: Diagnosis present

## 2017-06-26 HISTORY — DX: Adverse effect of unspecified anesthetic, initial encounter: T41.45XA

## 2017-06-26 HISTORY — DX: Other specified postprocedural states: R11.2

## 2017-06-26 HISTORY — DX: Gastro-esophageal reflux disease without esophagitis: K21.9

## 2017-06-26 HISTORY — DX: Other complications of anesthesia, initial encounter: T88.59XA

## 2017-06-26 HISTORY — DX: Other specified postprocedural states: Z98.890

## 2017-06-26 LAB — CBC WITH DIFFERENTIAL/PLATELET
BASOS ABS: 0 10*3/uL (ref 0.0–0.1)
Basophils Relative: 1 %
Eosinophils Absolute: 0.5 10*3/uL (ref 0.0–0.7)
Eosinophils Relative: 9 %
HEMATOCRIT: 42.8 % (ref 36.0–46.0)
HEMOGLOBIN: 13.6 g/dL (ref 12.0–15.0)
Lymphocytes Relative: 36 %
Lymphs Abs: 2.1 10*3/uL (ref 0.7–4.0)
MCH: 31.4 pg (ref 26.0–34.0)
MCHC: 31.8 g/dL (ref 30.0–36.0)
MCV: 98.8 fL (ref 78.0–100.0)
Monocytes Absolute: 0.4 10*3/uL (ref 0.1–1.0)
Monocytes Relative: 7 %
NEUTROS ABS: 2.8 10*3/uL (ref 1.7–7.7)
NEUTROS PCT: 47 %
Platelets: 232 10*3/uL (ref 150–400)
RBC: 4.33 MIL/uL (ref 3.87–5.11)
RDW: 12.3 % (ref 11.5–15.5)
WBC: 5.8 10*3/uL (ref 4.0–10.5)

## 2017-06-26 LAB — BASIC METABOLIC PANEL
ANION GAP: 11 (ref 5–15)
BUN: 14 mg/dL (ref 6–20)
CALCIUM: 8.9 mg/dL (ref 8.9–10.3)
CO2: 26 mmol/L (ref 22–32)
Chloride: 102 mmol/L (ref 101–111)
Creatinine, Ser: 0.75 mg/dL (ref 0.44–1.00)
Glucose, Bld: 91 mg/dL (ref 65–99)
Potassium: 3.8 mmol/L (ref 3.5–5.1)
SODIUM: 139 mmol/L (ref 135–145)

## 2017-06-26 LAB — TYPE AND SCREEN
ABO/RH(D): A POS
ANTIBODY SCREEN: NEGATIVE

## 2017-07-02 ENCOUNTER — Inpatient Hospital Stay (HOSPITAL_COMMUNITY): Payer: 59 | Admitting: Anesthesiology

## 2017-07-02 ENCOUNTER — Inpatient Hospital Stay (HOSPITAL_COMMUNITY)
Admission: RE | Admit: 2017-07-02 | Discharge: 2017-07-04 | DRG: 331 | Disposition: A | Discharge status: Home / Self Care | Payer: 59 | Source: Ambulatory Visit | Attending: General Surgery | Admitting: General Surgery

## 2017-07-02 ENCOUNTER — Encounter (HOSPITAL_COMMUNITY)
Admission: RE | Disposition: A | Discharge status: Home / Self Care | Payer: Self-pay | Source: Ambulatory Visit | Attending: General Surgery

## 2017-07-02 ENCOUNTER — Other Ambulatory Visit: Payer: Self-pay

## 2017-07-02 ENCOUNTER — Encounter (HOSPITAL_COMMUNITY): Payer: Self-pay | Admitting: *Deleted

## 2017-07-02 DIAGNOSIS — Z90721 Acquired absence of ovaries, unilateral: Secondary | ICD-10-CM

## 2017-07-02 DIAGNOSIS — Z91030 Bee allergy status: Secondary | ICD-10-CM | POA: Diagnosis not present

## 2017-07-02 DIAGNOSIS — Z79890 Hormone replacement therapy (postmenopausal): Secondary | ICD-10-CM | POA: Diagnosis not present

## 2017-07-02 DIAGNOSIS — K5732 Diverticulitis of large intestine without perforation or abscess without bleeding: Secondary | ICD-10-CM | POA: Diagnosis present

## 2017-07-02 DIAGNOSIS — Z885 Allergy status to narcotic agent status: Secondary | ICD-10-CM

## 2017-07-02 DIAGNOSIS — Z90710 Acquired absence of both cervix and uterus: Secondary | ICD-10-CM

## 2017-07-02 DIAGNOSIS — Z9049 Acquired absence of other specified parts of digestive tract: Secondary | ICD-10-CM | POA: Diagnosis not present

## 2017-07-02 DIAGNOSIS — K5730 Diverticulosis of large intestine without perforation or abscess without bleeding: Secondary | ICD-10-CM | POA: Diagnosis not present

## 2017-07-02 HISTORY — PX: PARTIAL COLECTOMY: SHX5273

## 2017-07-02 SURGERY — COLECTOMY, PARTIAL
Anesthesia: General

## 2017-07-02 MED ORDER — BUPIVACAINE LIPOSOME 1.3 % IJ SUSP
INTRAMUSCULAR | Status: DC | PRN
  Administered 2017-07-02: 20 mL

## 2017-07-02 MED ORDER — ONDANSETRON HCL 4 MG/2ML IJ SOLN
INTRAMUSCULAR | Status: AC
  Filled 2017-07-02: qty 2

## 2017-07-02 MED ORDER — FENTANYL CITRATE (PF) 250 MCG/5ML IJ SOLN
INTRAMUSCULAR | Status: AC
  Filled 2017-07-02: qty 5

## 2017-07-02 MED ORDER — SIMETHICONE 80 MG PO CHEW
40.0000 mg | CHEWABLE_TABLET | Freq: Four times a day (QID) | ORAL | Status: DC | PRN
  Administered 2017-07-03: 40 mg via ORAL
  Filled 2017-07-02: qty 1

## 2017-07-02 MED ORDER — ACETAMINOPHEN 325 MG PO TABS
ORAL_TABLET | ORAL | Status: AC
  Filled 2017-07-02: qty 2

## 2017-07-02 MED ORDER — TRAMADOL HCL 50 MG PO TABS: 50.0000 mg | ORAL_TABLET | Freq: Four times a day (QID) | ORAL | Status: DC | PRN

## 2017-07-02 MED ORDER — MIDAZOLAM HCL 2 MG/2ML IJ SOLN
INTRAMUSCULAR | Status: AC
  Filled 2017-07-02: qty 2

## 2017-07-02 MED ORDER — LACTATED RINGERS IV SOLN
INTRAVENOUS | Status: DC
  Administered 2017-07-02: 16:00:00 via INTRAVENOUS

## 2017-07-02 MED ORDER — SUGAMMADEX SODIUM 200 MG/2ML IV SOLN
INTRAVENOUS | Status: AC
  Filled 2017-07-02: qty 2

## 2017-07-02 MED ORDER — SUGAMMADEX SODIUM 200 MG/2ML IV SOLN
INTRAVENOUS | Status: DC | PRN
  Administered 2017-07-02: 200 mg via INTRAVENOUS

## 2017-07-02 MED ORDER — PROMETHAZINE HCL 25 MG/ML IJ SOLN
6.2500 mg | Freq: Once | INTRAMUSCULAR | Status: AC
  Administered 2017-07-02: 6.25 mg via INTRAVENOUS

## 2017-07-02 MED ORDER — ALVIMOPAN 12 MG PO CAPS
ORAL_CAPSULE | ORAL | Status: AC
  Filled 2017-07-02: qty 1

## 2017-07-02 MED ORDER — ALVIMOPAN 12 MG PO CAPS
12.0000 mg | ORAL_CAPSULE | ORAL | Status: AC
  Administered 2017-07-02: 12 mg via ORAL

## 2017-07-02 MED ORDER — DIPHENHYDRAMINE HCL 50 MG/ML IJ SOLN: 25.0000 mg | Freq: Four times a day (QID) | INTRAMUSCULAR | Status: DC | PRN

## 2017-07-02 MED ORDER — ROCURONIUM BROMIDE 100 MG/10ML IV SOLN
INTRAVENOUS | Status: DC | PRN
  Administered 2017-07-02: 45 mg via INTRAVENOUS
  Administered 2017-07-02: 5 mg via INTRAVENOUS

## 2017-07-02 MED ORDER — METOCLOPRAMIDE HCL 5 MG/ML IJ SOLN
5.0000 mg | Freq: Once | INTRAMUSCULAR | Status: AC
  Administered 2017-07-02: 5 mg via INTRAVENOUS

## 2017-07-02 MED ORDER — LORAZEPAM 2 MG/ML IJ SOLN: 1.0000 mg | INTRAMUSCULAR | Status: DC | PRN

## 2017-07-02 MED ORDER — FENTANYL CITRATE (PF) 100 MCG/2ML IJ SOLN
INTRAMUSCULAR | Status: DC | PRN
  Administered 2017-07-02: 50 ug via INTRAVENOUS
  Administered 2017-07-02: 100 ug via INTRAVENOUS
  Administered 2017-07-02: 50 ug via INTRAVENOUS
  Administered 2017-07-02: 100 ug via INTRAVENOUS

## 2017-07-02 MED ORDER — ONDANSETRON 4 MG PO TBDP
ORAL_TABLET | ORAL | Status: AC
  Filled 2017-07-02: qty 1

## 2017-07-02 MED ORDER — KETAMINE HCL 10 MG/ML IJ SOLN
INTRAMUSCULAR | Status: AC
  Filled 2017-07-02: qty 1

## 2017-07-02 MED ORDER — KETAMINE HCL 10 MG/ML IJ SOLN
INTRAMUSCULAR | Status: DC | PRN
  Administered 2017-07-02: 30 mg via INTRAVENOUS
  Administered 2017-07-02: 50 mg via INTRAVENOUS

## 2017-07-02 MED ORDER — KETOROLAC TROMETHAMINE 30 MG/ML IJ SOLN
30.0000 mg | Freq: Four times a day (QID) | INTRAMUSCULAR | Status: DC
  Administered 2017-07-02 – 2017-07-04 (×6): 30 mg via INTRAVENOUS
  Filled 2017-07-02 (×8): qty 1

## 2017-07-02 MED ORDER — HYDROMORPHONE HCL 1 MG/ML IJ SOLN: 1.0000 mg | INTRAMUSCULAR | Status: DC | PRN

## 2017-07-02 MED ORDER — PROMETHAZINE HCL 25 MG/ML IJ SOLN
INTRAMUSCULAR | Status: AC
  Filled 2017-07-02: qty 1

## 2017-07-02 MED ORDER — ACETAMINOPHEN 650 MG RE SUPP: 650.0000 mg | Freq: Four times a day (QID) | RECTAL | Status: DC | PRN

## 2017-07-02 MED ORDER — ENOXAPARIN SODIUM 40 MG/0.4ML ~~LOC~~ SOLN
40.0000 mg | Freq: Once | SUBCUTANEOUS | Status: AC
  Administered 2017-07-02: 40 mg via SUBCUTANEOUS

## 2017-07-02 MED ORDER — MIDAZOLAM HCL 2 MG/2ML IJ SOLN
INTRAMUSCULAR | Status: DC | PRN
  Administered 2017-07-02: 2 mg via INTRAVENOUS

## 2017-07-02 MED ORDER — ACETAMINOPHEN 325 MG PO TABS
650.0000 mg | ORAL_TABLET | Freq: Once | ORAL | Status: AC
  Administered 2017-07-02: 650 mg via ORAL

## 2017-07-02 MED ORDER — MIDAZOLAM HCL 2 MG/2ML IJ SOLN
1.0000 mg | Freq: Once | INTRAMUSCULAR | Status: AC | PRN
  Administered 2017-07-02: 2 mg via INTRAVENOUS

## 2017-07-02 MED ORDER — LIDOCAINE HCL (CARDIAC) 10 MG/ML IV SOLN
INTRAVENOUS | Status: DC | PRN
  Administered 2017-07-02: 50 mg via INTRAVENOUS

## 2017-07-02 MED ORDER — SCOPOLAMINE 1 MG/3DAYS TD PT72
1.0000 | MEDICATED_PATCH | Freq: Once | TRANSDERMAL | Status: DC
  Administered 2017-07-02: 1.5 mg via TRANSDERMAL

## 2017-07-02 MED ORDER — HYDROMORPHONE HCL 1 MG/ML IJ SOLN
0.2500 mg | INTRAMUSCULAR | Status: DC | PRN
  Administered 2017-07-02 (×3): 0.25 mg via INTRAVENOUS

## 2017-07-02 MED ORDER — ONDANSETRON HCL 4 MG/2ML IJ SOLN
4.0000 mg | Freq: Four times a day (QID) | INTRAMUSCULAR | Status: DC | PRN
  Administered 2017-07-03: 4 mg via INTRAVENOUS
  Filled 2017-07-02 (×3): qty 2

## 2017-07-02 MED ORDER — HYDROCODONE-ACETAMINOPHEN 7.5-325 MG PO TABS: 1.0000 | ORAL_TABLET | Freq: Once | ORAL | Status: DC | PRN

## 2017-07-02 MED ORDER — SCOPOLAMINE 1 MG/3DAYS TD PT72
MEDICATED_PATCH | TRANSDERMAL | Status: AC
  Filled 2017-07-02: qty 1

## 2017-07-02 MED ORDER — SODIUM CHLORIDE 0.9 % IV SOLN
1.0000 g | INTRAVENOUS | Status: AC
  Administered 2017-07-02: 1 g via INTRAVENOUS

## 2017-07-02 MED ORDER — CHLORHEXIDINE GLUCONATE CLOTH 2 % EX PADS: 6.0000 | MEDICATED_PAD | Freq: Once | CUTANEOUS | Status: DC

## 2017-07-02 MED ORDER — ALVIMOPAN 12 MG PO CAPS
12.0000 mg | ORAL_CAPSULE | Freq: Two times a day (BID) | ORAL | Status: DC
  Administered 2017-07-03 (×2): 12 mg via ORAL
  Filled 2017-07-02 (×2): qty 1

## 2017-07-02 MED ORDER — ENOXAPARIN SODIUM 40 MG/0.4ML ~~LOC~~ SOLN
SUBCUTANEOUS | Status: AC
  Filled 2017-07-02: qty 0.4

## 2017-07-02 MED ORDER — ENOXAPARIN SODIUM 40 MG/0.4ML ~~LOC~~ SOLN
40.0000 mg | SUBCUTANEOUS | Status: DC
  Administered 2017-07-03: 40 mg via SUBCUTANEOUS
  Filled 2017-07-02 (×2): qty 0.4

## 2017-07-02 MED ORDER — SODIUM CHLORIDE 0.9 % IV SOLN
INTRAVENOUS | Status: AC
  Filled 2017-07-02: qty 1

## 2017-07-02 MED ORDER — HYDROMORPHONE HCL 1 MG/ML IJ SOLN
INTRAMUSCULAR | Status: AC
  Filled 2017-07-02: qty 1

## 2017-07-02 MED ORDER — METOCLOPRAMIDE HCL 5 MG/ML IJ SOLN
INTRAMUSCULAR | Status: AC
  Filled 2017-07-02: qty 2

## 2017-07-02 MED ORDER — ONDANSETRON HCL 4 MG/2ML IJ SOLN
INTRAMUSCULAR | Status: DC | PRN
  Administered 2017-07-02: 4 mg via INTRAVENOUS

## 2017-07-02 MED ORDER — 0.9 % SODIUM CHLORIDE (POUR BTL) OPTIME
TOPICAL | Status: DC | PRN
  Administered 2017-07-02: 1000 mL
  Administered 2017-07-02: 2000 mL

## 2017-07-02 MED ORDER — ONDANSETRON HCL 4 MG/2ML IJ SOLN
4.0000 mg | Freq: Once | INTRAMUSCULAR | Status: AC
  Administered 2017-07-02: 4 mg via INTRAVENOUS

## 2017-07-02 MED ORDER — LACTATED RINGERS IV BOLUS (SEPSIS)
500.0000 mL | Freq: Once | INTRAVENOUS | Status: AC
  Administered 2017-07-02: 500 mL via INTRAVENOUS

## 2017-07-02 MED ORDER — PROPOFOL 10 MG/ML IV BOLUS
INTRAVENOUS | Status: DC | PRN
  Administered 2017-07-02: 20 mg via INTRAVENOUS
  Administered 2017-07-02: 150 mg via INTRAVENOUS
  Administered 2017-07-02: 30 mg via INTRAVENOUS

## 2017-07-02 MED ORDER — POVIDONE-IODINE 10 % OINT PACKET
TOPICAL_OINTMENT | CUTANEOUS | Status: DC | PRN
  Administered 2017-07-02: 1 via TOPICAL

## 2017-07-02 MED ORDER — ACETAMINOPHEN 325 MG PO TABS: 650.0000 mg | ORAL_TABLET | Freq: Four times a day (QID) | ORAL | Status: DC | PRN

## 2017-07-02 MED ORDER — KETOROLAC TROMETHAMINE 30 MG/ML IJ SOLN: 30.0000 mg | Freq: Four times a day (QID) | INTRAMUSCULAR | Status: DC | PRN

## 2017-07-02 MED ORDER — BUPIVACAINE LIPOSOME 1.3 % IJ SUSP
INTRAMUSCULAR | Status: AC
  Filled 2017-07-02: qty 20

## 2017-07-02 MED ORDER — ONDANSETRON 4 MG PO TBDP: 4.0000 mg | ORAL_TABLET | Freq: Four times a day (QID) | ORAL | Status: DC | PRN

## 2017-07-02 MED ORDER — ONDANSETRON 4 MG PO TBDP
4.0000 mg | ORAL_TABLET | Freq: Once | ORAL | Status: AC
  Administered 2017-07-02: 4 mg via ORAL

## 2017-07-02 MED ORDER — LACTATED RINGERS IV SOLN
INTRAVENOUS | Status: DC
  Administered 2017-07-02 (×2): via INTRAVENOUS

## 2017-07-02 MED ORDER — DIPHENHYDRAMINE HCL 25 MG PO CAPS: 25.0000 mg | ORAL_CAPSULE | Freq: Four times a day (QID) | ORAL | Status: DC | PRN

## 2017-07-02 SURGICAL SUPPLY — 72 items
APPLIER CLIP 11 MED OPEN (CLIP)
APPLIER CLIP 13 LRG OPEN (CLIP)
BAG HAMPER (MISCELLANEOUS) ×2 IMPLANT
BARRIER SKIN 2 3/4 (OSTOMY) IMPLANT
CHLORAPREP W/TINT 26ML (MISCELLANEOUS) ×2 IMPLANT
CLAMP POUCH DRAINAGE QUIET (OSTOMY) IMPLANT
CLIP APPLIE 11 MED OPEN (CLIP) IMPLANT
CLIP APPLIE 13 LRG OPEN (CLIP) IMPLANT
CLOTH BEACON ORANGE TIMEOUT ST (SAFETY) ×2 IMPLANT
COVER LIGHT HANDLE STERIS (MISCELLANEOUS) ×4 IMPLANT
COVER MAYO STAND XLG (DRAPE) ×2 IMPLANT
DRAPE UTILITY W/TAPE 26X15 (DRAPES) ×4 IMPLANT
DRAPE WARM FLUID 44X44 (DRAPE) ×2 IMPLANT
DRSG MEPILEX BORDER 4X8 (GAUZE/BANDAGES/DRESSINGS) IMPLANT
DRSG OPSITE POSTOP 4X10 (GAUZE/BANDAGES/DRESSINGS) IMPLANT
DRSG OPSITE POSTOP 4X8 (GAUZE/BANDAGES/DRESSINGS) ×2 IMPLANT
ELECT BLADE 6 FLAT ULTRCLN (ELECTRODE) ×2 IMPLANT
ELECT REM PT RETURN 9FT ADLT (ELECTROSURGICAL) ×2
ELECTRODE REM PT RTRN 9FT ADLT (ELECTROSURGICAL) ×1 IMPLANT
FORMALIN 10 PREFIL 480ML (MISCELLANEOUS) IMPLANT
GLOVE BIO SURGEON STRL SZ 6.5 (GLOVE) ×4 IMPLANT
GLOVE BIO SURGEON STRL SZ7 (GLOVE) ×4 IMPLANT
GLOVE BIOGEL M 6.5 STRL (GLOVE) ×4 IMPLANT
GLOVE BIOGEL PI IND STRL 6.5 (GLOVE) ×2 IMPLANT
GLOVE BIOGEL PI IND STRL 7.0 (GLOVE) ×4 IMPLANT
GLOVE BIOGEL PI IND STRL 7.5 (GLOVE) ×3 IMPLANT
GLOVE BIOGEL PI INDICATOR 6.5 (GLOVE) ×2
GLOVE BIOGEL PI INDICATOR 7.0 (GLOVE) ×4
GLOVE BIOGEL PI INDICATOR 7.5 (GLOVE) ×3
GLOVE SURG SS PI 7.5 STRL IVOR (GLOVE) ×6 IMPLANT
GOWN STRL REUS W/ TWL XL LVL3 (GOWN DISPOSABLE) ×2 IMPLANT
GOWN STRL REUS W/TWL LRG LVL3 (GOWN DISPOSABLE) ×8 IMPLANT
GOWN STRL REUS W/TWL XL LVL3 (GOWN DISPOSABLE) ×2
HANDLE SUCTION POOLE (INSTRUMENTS) ×1 IMPLANT
INST SET MAJOR GENERAL (KITS) ×2 IMPLANT
KIT BLADEGUARD II DBL (SET/KITS/TRAYS/PACK) ×2 IMPLANT
KIT ROOM TURNOVER APOR (KITS) ×2 IMPLANT
LIGASURE IMPACT 36 18CM CVD LR (INSTRUMENTS) ×2 IMPLANT
MANIFOLD NEPTUNE II (INSTRUMENTS) ×2 IMPLANT
NEEDLE HYPO 18GX1.5 BLUNT FILL (NEEDLE) ×2 IMPLANT
NEEDLE HYPO 21X1.5 SAFETY (NEEDLE) ×2 IMPLANT
NS IRRIG 1000ML POUR BTL (IV SOLUTION) ×4 IMPLANT
PACK ABDOMINAL MAJOR (CUSTOM PROCEDURE TRAY) ×2 IMPLANT
PAD ARMBOARD 7.5X6 YLW CONV (MISCELLANEOUS) ×2 IMPLANT
PENCIL HANDSWITCHING (ELECTRODE) ×2 IMPLANT
POUCH OSTOMY 2 3/4  H 3804 (WOUND CARE)
POUCH OSTOMY 2 PC DRNBL 2.25 (WOUND CARE) IMPLANT
POUCH OSTOMY 2 PC DRNBL 2.75 (WOUND CARE) IMPLANT
POUCH OSTOMY DRNBL 2 1/4 (WOUND CARE)
RELOAD LINEAR CUT PROX 55 BLUE (ENDOMECHANICALS) IMPLANT
RELOAD PROXIMATE 75MM BLUE (ENDOMECHANICALS) ×4 IMPLANT
RETRACTOR WND ALEXIS 25 LRG (MISCELLANEOUS) ×1 IMPLANT
RTRCTR WOUND ALEXIS 25CM LRG (MISCELLANEOUS) ×2
SET BASIN LINEN APH (SET/KITS/TRAYS/PACK) ×2 IMPLANT
SPONGE LAP 18X18 X RAY DECT (DISPOSABLE) ×2 IMPLANT
STAPLER GUN LINEAR PROX 60 (STAPLE) ×2 IMPLANT
STAPLER PROXIMATE 55 BLUE (STAPLE) IMPLANT
STAPLER PROXIMATE 75MM BLUE (STAPLE) ×2 IMPLANT
STAPLER VISISTAT (STAPLE) ×2 IMPLANT
SUCTION POOLE HANDLE (INSTRUMENTS) ×2
SUCTION YANKAUER HANDLE (MISCELLANEOUS) ×2 IMPLANT
SUT CHROMIC 0 SH (SUTURE) IMPLANT
SUT CHROMIC 2 0 SH (SUTURE) IMPLANT
SUT CHROMIC 3 0 SH 27 (SUTURE) IMPLANT
SUT NOVA NAB GS-26 0 60 (SUTURE) IMPLANT
SUT PDS AB 0 CTX 60 (SUTURE) ×4 IMPLANT
SUT SILK 2 0 (SUTURE)
SUT SILK 2-0 18XBRD TIE 12 (SUTURE) IMPLANT
SUT SILK 3 0 SH CR/8 (SUTURE) ×2 IMPLANT
SYR 20CC LL (SYRINGE) ×2 IMPLANT
TOWEL OR 17X26 4PK STRL BLUE (TOWEL DISPOSABLE) ×2 IMPLANT
TRAY FOLEY CATH SILVER 16FR (SET/KITS/TRAYS/PACK) ×2 IMPLANT

## 2017-07-02 NOTE — Interval H&P Note (Signed)
History and Physical Interval Note:  07/02/2017 9:18 AM  Denise Taylor  has presented today for surgery, with the diagnosis of diverticulitis  The various methods of treatment have been discussed with the patient and family. After consideration of risks, benefits and other options for treatment, the patient has consented to  Procedure(s): PARTIAL COLECTOMY (N/A) as a surgical intervention .  The patient's history has been reviewed, patient examined, no change in status, stable for surgery.  I have reviewed the patient's chart and labs.  Questions were answered to the patient's satisfaction.     Aviva Signs

## 2017-07-02 NOTE — Op Note (Signed)
Patient:  Denise Taylor  DOB:  1960/08/18  MRN:  [id number]   Preop Diagnosis: Sigmoid diverticulitis  Postop Diagnosis: Same  Procedure: Partial colectomy  Surgeon: Aviva Signs, MD  Assistant: Blake Divine, MD  Anes: General endotracheal  Indications: Patient is a 57 year old white female who presents with recurrent episodes of sigmoid diverticulitis.  The risks and benefits of the procedure including bleeding, infection, the possibility of anastomotic leak, and the possibility of recurrence of the diverticulitis were fully explained to the patient, who gave informed consent.  Procedure note: The patient was placed in the lithotomy position after induction of general endotracheal anesthesia.  The abdomen was prepped and draped using the usual sterile technique with ChloraPrep.  The perineum was prepped and draped using usual sterile technique with Betadine.  Surgical site confirmation was performed.  A lower midline incision was made down to the fascia.  Peritoneal cavity was entered into without difficulty.  The small bowel was inspected and noted to be within normal limits.  The sigmoid colon was inspected and thickened mesentery and bowel wall was noted along the distal aspect of the sigmoid colon.  The descending colon was inspected and there was no significant diverticular disease seen.  The upper rectum was inspected and also noted to be without significant diverticular disease.  It was then elected to proceed with a sigmoid colectomy.  The descending colon was mobilized along the peritoneal reflection.  This was likewise done along the sigmoid colon.  Care was taken to avoid the left ureter which was identified.  Once the sigmoid colon down to the distal portion was mobilized, a GIA 75 stapler was placed proximally across the sigmoid colon and distally along the sigmoid colon and fired.  The mesentery was then divided using the LigaSure.  The specimen was sent to pathology for  further examination.  A side to side colocolostomy was performed using a GIA 75 stapler.  The colotomy was closed using a TA 60 stapler.  The staple line was bolstered using 3-0 silk Lembert sutures.  Surrounding adipose tissue was placed over the anastomosis.  The pelvis was then copiously irrigated with normal saline.  All fluid was then evacuated from the abdominal cavity.  All operating personnel then changed her gown and gloves.  A new setup was used for closure.  The fascia was reapproximated using a looped 0 PDS running suture.  Subcutaneous layer was irrigated with normal saline.  Exparel was instilled into the surrounding wound.  The skin was closed using staples.  Betadine ointment and dry sterile dressings were applied.  All tape and needle counts were correct at the end of the procedure.  Patient was extubated in the operating room and transferred to PACU in stable condition.  Complications: None  EBL: 25 cc  Specimen: Sigmoid colon

## 2017-07-02 NOTE — Anesthesia Procedure Notes (Signed)
Procedure Name: Intubation Date/Time: 07/02/2017 10:04 AM Performed by: Vista Deck, CRNA Pre-anesthesia Checklist: Patient identified, Patient being monitored, Timeout performed, Emergency Drugs available and Suction available Patient Re-evaluated:Patient Re-evaluated prior to induction Oxygen Delivery Method: Circle System Utilized Preoxygenation: Pre-oxygenation with 100% oxygen Induction Type: IV induction Ventilation: Mask ventilation without difficulty Laryngoscope Size: Mac and 3 Grade View: Grade I Tube type: Oral Tube size: 7.0 mm Number of attempts: 1 Airway Equipment and Method: stylet and Oral airway Placement Confirmation: ETT inserted through vocal cords under direct vision,  positive ETCO2 and breath sounds checked- equal and bilateral Secured at: 21 cm Tube secured with: Tape Dental Injury: Teeth and Oropharynx as per pre-operative assessment

## 2017-07-02 NOTE — Transfer of Care (Signed)
Immediate Anesthesia Transfer of Care Note  Patient: Yamileth Hayse  Procedure(s) Performed: PARTIAL COLECTOMY (N/A )  Patient Location: PACU  Anesthesia Type:General  Level of Consciousness: sedated and patient cooperative  Airway & Oxygen Therapy: Patient Spontanous Breathing and non-rebreather face mask  Post-op Assessment: Report given to RN and Post -op Vital signs reviewed and stable  Post vital signs: Reviewed and stable  Last Vitals:  Vitals:   07/02/17 0915 07/02/17 0920  BP: 122/75 123/74  Pulse:    Resp: 17 (!) 23  Temp:    SpO2: 100% 100%    Last Pain:  Vitals:   07/02/17 0819  TempSrc: Oral      Patients Stated Pain Goal: 8 (76/39/43 2003)  Complications: No apparent anesthesia complications

## 2017-07-02 NOTE — Anesthesia Preprocedure Evaluation (Signed)
Anesthesia Evaluation  Patient identified by MRN, date of birth, ID band Patient awake    History of Anesthesia Complications (+) PONV  Airway Mallampati: I  TM Distance: >3 FB     Dental  (+) Teeth Intact   Pulmonary    Pulmonary exam normal        Cardiovascular Exercise Tolerance: Good METS: 7 - 9 Mets Normal cardiovascular exam Rhythm:Regular Rate:Normal  Recent ECG at outside institution 4 months ago  "normal" per patient.    Neuro/Psych    GI/Hepatic GERD  Medicated and Controlled,Diverticulitis   Endo/Other    Renal/GU      Musculoskeletal   Abdominal Normal abdominal exam  (+)   Peds  Hematology Results for Denise Taylor, Denise Taylor (MRN [id number]) as of 07/02/2017 08:37  06/26/2017 13:25 Hemoglobin: 13.6 HCT: 42.8    Anesthesia Other Findings   Reproductive/Obstetrics                             Anesthesia Physical Anesthesia Plan  ASA: II  Anesthesia Plan: General   Post-op Pain Management:    Induction: Intravenous  PONV Risk Score and Plan:   Airway Management Planned: Oral ETT  Additional Equipment:   Intra-op Plan:   Post-operative Plan:   Informed Consent: I have reviewed the patients History and Physical, chart, labs and discussed the procedure including the risks, benefits and alternatives for the proposed anesthesia with the patient or authorized representative who has indicated his/her understanding and acceptance.   Dental advisory given  Plan Discussed with: CRNA  Anesthesia Plan Comments:         Anesthesia Quick Evaluation

## 2017-07-02 NOTE — Anesthesia Postprocedure Evaluation (Signed)
Anesthesia Post Note  Patient: Denise Taylor  Procedure(s) Performed: PARTIAL COLECTOMY (N/A )  Anesthesia Type: General Level of consciousness: awake and alert and patient cooperative Pain management: satisfactory to patient Vital Signs Assessment: post-procedure vital signs reviewed and stable Respiratory status: spontaneous breathing Cardiovascular status: stable Postop Assessment: no apparent nausea or vomiting Anesthetic complications: no     Last Vitals:  Vitals:   07/02/17 1200 07/02/17 1215  BP: 120/69 (!) 148/81  Pulse: 65 86  Resp: 17 16  Temp:    SpO2: 97% 97%    Last Pain:  Vitals:   07/02/17 1200  TempSrc:   PainSc: 2                  Meleah Demeyer

## 2017-07-03 ENCOUNTER — Encounter (HOSPITAL_COMMUNITY): Payer: Self-pay | Admitting: General Surgery

## 2017-07-03 LAB — BASIC METABOLIC PANEL
Anion gap: 8 (ref 5–15)
BUN: 8 mg/dL (ref 6–20)
CO2: 25 mmol/L (ref 22–32)
CREATININE: 0.72 mg/dL (ref 0.44–1.00)
Calcium: 8.4 mg/dL — ABNORMAL LOW (ref 8.9–10.3)
Chloride: 104 mmol/L (ref 101–111)
GFR calc Af Amer: >60 mL/min (ref >60)
Glucose, Bld: 94 mg/dL (ref 65–99)
Potassium: 3.4 mmol/L — ABNORMAL LOW (ref 3.5–5.1)
SODIUM: 137 mmol/L (ref 135–145)

## 2017-07-03 LAB — CBC
HCT: 33.8 % — ABNORMAL LOW (ref 36.0–46.0)
HEMOGLOBIN: 11 g/dL — AB (ref 12.0–15.0)
MCH: 32.1 pg (ref 26.0–34.0)
MCHC: 32.5 g/dL (ref 30.0–36.0)
MCV: 98.5 fL (ref 78.0–100.0)
Platelets: 222 10*3/uL (ref 150–400)
RBC: 3.43 MIL/uL — ABNORMAL LOW (ref 3.87–5.11)
RDW: 12.4 % (ref 11.5–15.5)
WBC: 5.6 10*3/uL (ref 4.0–10.5)

## 2017-07-03 LAB — MAGNESIUM: MAGNESIUM: 1.7 mg/dL (ref 1.7–2.4)

## 2017-07-03 LAB — PHOSPHORUS: Phosphorus: 3 mg/dL (ref 2.5–4.6)

## 2017-07-03 MED ORDER — KCL IN DEXTROSE-NACL 20-5-0.45 MEQ/L-%-% IV SOLN
INTRAVENOUS | Status: DC
  Administered 2017-07-03 – 2017-07-04 (×3): via INTRAVENOUS

## 2017-07-03 NOTE — Anesthesia Postprocedure Evaluation (Signed)
Anesthesia Post Note  Patient: Denise Taylor  Procedure(s) Performed: PARTIAL COLECTOMY (N/A )  Patient location during evaluation: Nursing Unit Anesthesia Type: General Level of consciousness: awake and alert Pain management: pain level controlled Vital Signs Assessment: post-procedure vital signs reviewed and stable Respiratory status: spontaneous breathing, nonlabored ventilation and respiratory function stable Cardiovascular status: blood pressure returned to baseline Postop Assessment: no apparent nausea or vomiting and adequate PO intake Anesthetic complications: no     Last Vitals:  Vitals:   07/03/17 0121 07/03/17 0421  BP: (!) 110/50 (!) 107/54  Pulse: (!) 56 72  Resp: 18 18  Temp: 36.9 C 37.1 C  SpO2: 98% 93%    Last Pain:  Vitals:   07/03/17 0421  TempSrc: Oral  PainSc:                  Felipe Paluch J

## 2017-07-03 NOTE — Addendum Note (Signed)
Addendum  created 07/03/17 1059 by Charmaine Downs, CRNA   Sign clinical note

## 2017-07-03 NOTE — Progress Notes (Signed)
1 Day Post-Op  Subjective: Patient awake and alert.  Denies any significant abdominal pain.  No nausea noted.  Has not had a bowel movement yet.  Objective: Vital signs in last 24 hours: Temp:  [97.5 F (36.4 C)-98.7 F (37.1 C)] 98.7 F (37.1 C) (01/29 0421) Pulse Rate:  [50-86] 72 (01/29 0421) Resp:  [15-26] 18 (01/29 0421) BP: (106-149)/(50-95) 107/54 (01/29 0421) SpO2:  [93 %-100 %] 93 % (01/29 0421) Weight:  [196 lb 3.4 oz (89 kg)] 196 lb 3.4 oz (89 kg) (01/29 0639)    Intake/Output from previous day: 01/28 0701 - 01/29 0700 In: 2000 [P.O.:460; I.V.:1540] Out: 1005 [Urine:750; Emesis/NG output:230; Blood:25] Intake/Output this shift: No intake/output data recorded.  General appearance: alert, cooperative and no distress Resp: clear to auscultation bilaterally Cardio: regular rate and rhythm, S1, S2 normal, no murmur, click, rub or gallop GI: Soft, incision healing well.  Minimal bowel sounds appreciated.  Lab Results:  Recent Labs    07/03/17 0558  WBC 5.6  HGB 11.0*  HCT 33.8*  PLT 222   BMET Recent Labs    07/03/17 0558  NA 137  K 3.4*  CL 104  CO2 25  GLUCOSE 94  BUN 8  CREATININE 0.72  CALCIUM 8.4*   PT/INR No results for input(s): LABPROT, INR in the last 72 hours.  Studies/Results: No results found.  Anti-infectives: Anti-infectives (From admission, onward)   Start     Dose/Rate Route Frequency Ordered Stop   07/02/17 0828  ertapenem [facility]) 1 g in sodium chloride 0.9 % 50 mL IVPB     1 g 100 mL/hr over 30 Minutes Intravenous On call to O.R. 07/02/17 5631 07/02/17 1035      Assessment/Plan: s/p Procedure(s): PARTIAL COLECTOMY Impression: Stable on postoperative day 1.  Will adjust IV fluids.  Encourage ambulation.  Advance to full liquid diet.  LOS: 1 day    Aviva Signs 07/03/2017

## 2017-07-04 LAB — MAGNESIUM: Magnesium: 1.8 mg/dL (ref 1.7–2.4)

## 2017-07-04 LAB — BASIC METABOLIC PANEL
Anion gap: 7 (ref 5–15)
BUN: 8 mg/dL (ref 6–20)
CHLORIDE: 106 mmol/L (ref 101–111)
CO2: 26 mmol/L (ref 22–32)
CREATININE: 0.76 mg/dL (ref 0.44–1.00)
Calcium: 8 mg/dL — ABNORMAL LOW (ref 8.9–10.3)
GFR calc Af Amer: >60 mL/min (ref >60)
GFR calc non Af Amer: >60 mL/min (ref >60)
Glucose, Bld: 113 mg/dL — ABNORMAL HIGH (ref 65–99)
Potassium: 3.7 mmol/L (ref 3.5–5.1)
SODIUM: 139 mmol/L (ref 135–145)

## 2017-07-04 LAB — CBC
HEMATOCRIT: 31.7 % — AB (ref 36.0–46.0)
HEMOGLOBIN: 10.2 g/dL — AB (ref 12.0–15.0)
MCH: 32.2 pg (ref 26.0–34.0)
MCHC: 32.2 g/dL (ref 30.0–36.0)
MCV: 100 fL (ref 78.0–100.0)
Platelets: 207 10*3/uL (ref 150–400)
RBC: 3.17 MIL/uL — ABNORMAL LOW (ref 3.87–5.11)
RDW: 12.5 % (ref 11.5–15.5)
WBC: 4.9 10*3/uL (ref 4.0–10.5)

## 2017-07-04 LAB — PHOSPHORUS: Phosphorus: 2.9 mg/dL (ref 2.5–4.6)

## 2017-07-04 NOTE — Plan of Care (Signed)
  Progressing Health Behavior/Discharge Planning: Ability to manage health-related needs will improve 07/04/2017 0057 - Progressing by Cassandria Anger, RN Clinical Measurements: Will remain free from infection 07/04/2017 0057 - Progressing by Cassandria Anger, RN Cardiovascular complication will be avoided 07/04/2017 0057 - Progressing by Cassandria Anger, RN Activity: Risk for activity intolerance will decrease 07/04/2017 0057 - Progressing by Cassandria Anger, RN Pain Managment: General experience of comfort will improve 07/04/2017 0057 - Progressing by Cassandria Anger, RN Safety: Ability to remain free from injury will improve 07/04/2017 0057 - Progressing by Cassandria Anger, RN

## 2017-07-04 NOTE — Progress Notes (Signed)
Pt IV removed, WNL. D/C instructions given to pt. Verbalized understanding. Pt daughter at bedside to transport home.  

## 2017-07-04 NOTE — Discharge Instructions (Signed)
Open Colectomy, Care After °This sheet gives you information about how to care for yourself after your procedure. Your health care provider may also give you more specific instructions. If you have problems or questions, contact your health care provider. °What can I expect after the procedure? °After the procedure, it is common to have: °· Pain in your abdomen, especially along your incision. °· Tiredness. Your energy level will return to normal over the next several weeks. °· Constipation. °· Nausea. °· Difficulty urinating. ° °Follow these instructions at home: °Activity °· You may be able to return to most of your normal activities within 1-2 weeks, such as working, walking up stairs, and sexual activity. °· Avoid activities that require a lot of energy for 4-6 weeks after surgery, such as running, climbing, and lifting heavy objects. Ask your health care provider what activities are safe for you. °· Take rest breaks during the day as needed. °· Do not drive for 1-2 weeks or until your health care provider says that it is safe. °· Do not drive or use heavy machinery while taking prescription pain medicines. °· Do not lift anything that is heavier than 10 lb (4.3 kg) until your health care provider says that it is safe. °Incision care °· Follow instructions from your health care provider about how to take care of your incision. Make sure you: °? Wash your hands with soap and water before you change your bandage (dressing). If soap and water are not available, use hand sanitizer. °? Change your dressing as told by your health care provider. °? Leave stitches (sutures) or staples in place. These skin closures may need to stay in place for 2 weeks or longer. °· Avoid wearing tight clothing around your incision. °· Protect your incision area from the sun. °· Check your incision area every day for signs of infection. Check for: °? More redness, swelling, or pain. °? More fluid or blood. °? Warmth. °? Pus or a bad  smell. °General instructions °· Do not take baths, swim, or use a hot tub until your health care provider approves. Ask your health care provider when you may shower. °· Take over-the-counter and prescription medicines, including stool softeners, only as told by your health care provider. °· Eat a low-fat and low-fiber diet for the first 4 weeks after surgery. °· Keep all follow-up visits as told by your health care provider. This is important. °Contact a health care provider if: °· You have more redness, swelling, or pain around your incision. °· You have more fluid or blood coming from your incision. °· Your incision feels warm to the touch. °· You have pus or a bad smell coming from your incision. °· You have a fever or chills. °· You do not have a bowel movement 2-3 days after surgery. °· You cannot eat or drink for 24 hours or more. °· You have persistent nausea and vomiting. °· You have abdominal pain that gets worse and does not get better with medicine. °Get help right away if: °· You have chest pain. °· You have shortness of breath. °· You have pain or swelling in your legs. °· Your incision breaks open after your sutures or staples have been removed. °· You have bleeding from the rectum. °This information is not intended to replace advice given to you by your health care provider. Make sure you discuss any questions you have with your health care provider. °Document Released: 12/13/2010 Document Revised: 02/21/2016 Document Reviewed: 02/21/2016 °Elsevier Interactive Patient   Education © 2018 [employer] ° °

## 2017-07-04 NOTE — Progress Notes (Signed)
Pt stated she had a small BM last night and a large one this morning. Will hold the ENTEREG since BM has happened. Pt also ambulated several times this morning. Tolerated well.

## 2017-07-04 NOTE — Discharge Summary (Signed)
Physician Discharge Summary  Patient ID: Mariza Bourget MRN: [id number] DOB/AGE: 09-Apr-1961 57 y.o.  Admit date: 07/02/2017 Discharge date: 07/04/2017  Admission Diagnoses: Recurrent sigmoid diverticulitis  Discharge Diagnoses: Same Active Problems:   Diverticulitis of sigmoid colon   S/P partial colectomy   Discharged Condition: good  Hospital Course: Patient is a 57 year old white female with a history of recurrent sigmoid diverticulitis who underwent a sigmoid colectomy on 07/02/2017.  She tolerated the surgery well.  Her postoperative course was unremarkable.  Her diet was advanced without difficulty once her bowel function returned.  Final pathology would revealed diverticulosis with no evidence of malignancy.  The patient is being discharged home on postoperative day 2 in good and improving condition.  Treatments: surgery: Partial colectomy on 07/02/2017  Discharge Exam: Blood pressure 117/67, pulse 64, temperature 97.9 F (36.6 C), temperature source Oral, resp. rate 18, height 5\' 2"  (1.575 m), weight 196 lb 3.4 oz (89 kg), SpO2 95 %. General appearance: alert, cooperative and no distress Resp: clear to auscultation bilaterally Cardio: regular rate and rhythm, S1, S2 normal, no murmur, click, rub or gallop GI: Soft, incision healing well.  Disposition: 01-Home or Self Care  Discharge Instructions    Diet - low sodium heart healthy   Complete by:  As directed    Increase activity slowly   Complete by:  As directed      Allergies as of 07/04/2017      Reactions   Bee Venom Anaphylaxis   Codeine Nausea Only      Medication List    STOP taking these medications   ciprofloxacin 500 MG tablet Commonly known as:  CIPRO   metroNIDAZOLE 250 MG tablet Commonly known as:  FLAGYL   neomycin 500 MG tablet Commonly known as:  MYCIFRADIN     TAKE these medications   EPIPEN 2-PAK 0.3 mg/0.3 mL Soaj injection Generic drug:  EPINEPHrine Inject 0.3 mg into the muscle as  needed (bee stings).   estradiol 0.0375 MG/24HR Commonly known as:  VIVELLE-DOT APPLY 1 PATCH TWO TIMES WEEKLY What changed:    how much to take  how to take this  when to take this  additional instructions   ibuprofen 800 MG tablet Commonly known as:  ADVIL,MOTRIN Take 1 tablet (800 mg total) by mouth 3 (three) times daily. What changed:    when to take this  reasons to take this   omeprazole 20 MG capsule Commonly known as:  PRILOSEC Take 1 capsule (20 mg total) by mouth daily as needed. Indigestion. What changed:    reasons to take this  additional instructions   ondansetron 4 MG disintegrating tablet Commonly known as:  ZOFRAN-ODT Take 4 mg by mouth every 8 (eight) hours as needed for nausea or vomiting.      Follow-up Information    Aviva Signs, MD. Schedule an appointment as soon as possible for a visit on 07/10/2017.   Specialty:  General Surgery Contact information: [id number] [place] [address] [phone]           Signed: Aviva Signs 07/04/2017, 9:35 AM

## 2017-07-10 ENCOUNTER — Ambulatory Visit (INDEPENDENT_AMBULATORY_CARE_PROVIDER_SITE_OTHER): Payer: Self-pay | Admitting: General Surgery

## 2017-07-10 ENCOUNTER — Encounter: Payer: Self-pay | Admitting: General Surgery

## 2017-07-10 VITALS — BP 138/64 | HR 77 | Temp 97.8°F | Ht 63.0 in | Wt 182.0 lb

## 2017-07-10 DIAGNOSIS — Z09 Encounter for follow-up examination after completed treatment for conditions other than malignant neoplasm: Secondary | ICD-10-CM

## 2017-07-10 DIAGNOSIS — K5732 Diverticulitis of large intestine without perforation or abscess without bleeding: Secondary | ICD-10-CM

## 2017-07-10 NOTE — Progress Notes (Signed)
Subjective:     Denise Taylor  Status post partial colectomy.  Doing very well.  Denies any fevers.  Minimal incisional pain with movement.  Bowel movements are normal. Objective:    BP 138/64   Pulse 77   Temp 97.8 F (36.6 C)   Ht 5\' 3"  (1.6 m)   Wt 182 lb (82.6 kg)   BMI 32.24 kg/m   General:  alert, cooperative and no distress  Abdomen soft, incision healing well.  Staples removed, Steri-Strips applied. Final pathology consistent with diagnosis.     Assessment:    Doing well postoperatively.    Plan:  May increase activity as able, though avoid any significant heavy lifting, prolonged standing.  Follow-up here in 3 weeks.

## 2017-07-26 ENCOUNTER — Ambulatory Visit (INDEPENDENT_AMBULATORY_CARE_PROVIDER_SITE_OTHER): Payer: Self-pay | Admitting: General Surgery

## 2017-07-26 ENCOUNTER — Encounter: Payer: Self-pay | Admitting: General Surgery

## 2017-07-26 VITALS — BP 135/75 | HR 82 | Temp 97.7°F | Ht 63.0 in | Wt 181.0 lb

## 2017-07-26 DIAGNOSIS — Z09 Encounter for follow-up examination after completed treatment for conditions other than malignant neoplasm: Secondary | ICD-10-CM

## 2017-07-26 MED ORDER — CIPROFLOXACIN HCL 500 MG PO TABS: 500.0000 mg | ORAL_TABLET | Freq: Two times a day (BID) | ORAL | 0 refills | Status: DC

## 2017-07-26 NOTE — Progress Notes (Signed)
Subjective:     Denise Taylor  Presents with overall achiness.  She states she has had fevers at home, but has been afebrile over the past few days.  She denies any nausea or vomiting.  She denies any diarrhea.  She has some pain when her bladder is full, but this is along the lower portion of the incision line.  She denies any dysuria at the urethra.  Her appetite is decreased, but she is eating.  She currently has a pain level of 2 out of 10. Objective:    BP 135/75   Pulse 82   Temp 97.7 F (36.5 C)   Ht 5\' 3"  (1.6 m)   Wt 181 lb (82.1 kg)   BMI 32.06 kg/m   General:  alert, cooperative and fatigued  Lungs are clear to auscultation with equal breath sounds bilaterally.  Heart examination reveals a regular rate and rhythm without S3, S4, murmurs Abdomen is soft, nontender, nondistended.  No rigidity is noted.  The surgical incision is well-healed.  Some tenderness to palpation along the inferior aspect of the incision.      Assessment:    Nonspecific body ache    Plan:  I suspect the patient has had flulike symptoms for the past week.  Given her already compromised state from surgery, I am placing her on ciprofloxacin 500 mg p.o. twice daily times 5 days.  Should she not improve significantly, will order CT scan of the abdomen as well as a CBC.  She is to follow-up in my office in 5 days.

## 2017-07-31 ENCOUNTER — Encounter: Payer: Self-pay | Admitting: Nurse Practitioner

## 2017-07-31 ENCOUNTER — Ambulatory Visit (INDEPENDENT_AMBULATORY_CARE_PROVIDER_SITE_OTHER): Payer: Self-pay | Admitting: General Surgery

## 2017-07-31 ENCOUNTER — Ambulatory Visit (INDEPENDENT_AMBULATORY_CARE_PROVIDER_SITE_OTHER): Payer: 59 | Admitting: Nurse Practitioner

## 2017-07-31 ENCOUNTER — Encounter: Payer: Self-pay | Admitting: General Surgery

## 2017-07-31 ENCOUNTER — Encounter: Payer: Self-pay | Admitting: Gastroenterology

## 2017-07-31 VITALS — BP 124/80 | HR 67 | Temp 97.0°F | Ht 62.5 in | Wt 182.6 lb

## 2017-07-31 VITALS — BP 122/76 | HR 75 | Temp 98.4°F | Ht 63.0 in | Wt 181.0 lb

## 2017-07-31 DIAGNOSIS — R1310 Dysphagia, unspecified: Secondary | ICD-10-CM | POA: Insufficient documentation

## 2017-07-31 DIAGNOSIS — K5732 Diverticulitis of large intestine without perforation or abscess without bleeding: Secondary | ICD-10-CM | POA: Diagnosis not present

## 2017-07-31 DIAGNOSIS — R1319 Other dysphagia: Secondary | ICD-10-CM

## 2017-07-31 DIAGNOSIS — Z09 Encounter for follow-up examination after completed treatment for conditions other than malignant neoplasm: Secondary | ICD-10-CM

## 2017-07-31 NOTE — Assessment & Plan Note (Addendum)
The patient describes rare dysphagia with solid foods.  She has a history of esophageal dysphagia requiring dilation.  She states it is not quite bothersome yet.  She wants to follow-up on this because she just recently had surgery with anesthesia.  I have indicated if she has any worsening symptoms to call us.  No overt GERD symptoms.  Continue PPI.  Otherwise follow-up in 3 months.  She is agreeable.

## 2017-07-31 NOTE — Progress Notes (Signed)
Subjective:     Denise Taylor  Status post partial colectomy.  Did have episode of generalized malaise and lower abdominal pain.  She also had pressure sensation when she voided.  She was started on ciprofloxacin and this has not fully resolved.  She feels fine. Objective:    BP 122/76   Pulse 75   Temp 98.4 F (36.9 C)   Ht 5\' 3"  (1.6 m)   Wt 181 lb (82.1 kg)   BMI 32.06 kg/m   General:  alert, cooperative and no distress  Abdomen soft, nontender, nondistended.  Incision well-healed.     Assessment:    Doing well postoperatively.    Plan:  May return to work without restrictions on 08/08/2017.  Follow-up here as needed.

## 2017-07-31 NOTE — Patient Instructions (Signed)
1. Continue your current medications. 2. Follow-up with the surgeon as they recommend. 3. Return for follow-up in 3 months. 4. Call us if you have any worsening symptoms before then. 5. Call us if you have any questions or concerns.

## 2017-07-31 NOTE — Progress Notes (Signed)
CC'ED TO PCP 

## 2017-07-31 NOTE — Progress Notes (Signed)
Referring Provider: Rory Percy, MD Primary Care Physician:  Rory Percy, MD Primary GI:  Dr. Oneida Alar  Chief Complaint  Patient presents with  . Diverticulitis    f/u, had part of colon removed, doing ok    HPI:   Denise Taylor is a 57 y.o. female who presents for follow-up on diverticulitis and constipation.  The patient was last seen in our office 05/02/2017 for diverticulitis.  History of chronic constipation.  Her last visit represented her fourth episode of diverticulitis that year.  Colonoscopy up-to-date 2014 and next due in 2024.  History of polyps on colonoscopy.  Her last visit her pain was much improved on antibiotics after diverticulitis episode.  Recommended colonoscopy, surgical referral, follow-up in 3 months.  Colonoscopy was completed again 05/07/2017 and found diverticulosis in the rectosigmoid colon, sigmoid colon, descending colon, transverse colon.  Significant looping of the colon, external and internal hemorrhoids.  Commended surgical referral for possible sigmoid resection due to multiple episodes of diverticulitis.  Repeat colonoscopy in 5-10 years.  She underwent partial colectomy on 07/02/2017 with removal of the sigmoid colon.  Surgical pathology of the sigmoid colon found diverticulosis without dysplasia or malignancy.  He had follow-up office visit with the surgeon on 07/10/2017 which time the noted she was doing very well, no fevers, minimal incisional pain with bowel movement and noted normal bowel movements.  Recommend increase activity as able, avoid heavy lifting and prolonged standing.  Follow-up in 3 weeks.  Today she states she's doing well overall. Miniamal to no pain post-op. Denies abdominal pain, N/V, hematochezia, melena, fever, chills, unintentional weight loss. Bowel movements are a bit soft but not bothersome and no diarrhea. Rare dysphagia, has history of esophageal dilation. Denies chest pain, dyspnea, dizziness, lightheadedness, syncope, near  syncope. Denies any other upper or lower GI symptoms.  Past Medical History:  Diagnosis Date  . Complication of anesthesia   . Current use of estrogen therapy 02/13/2014  . Diverticulitis    4 episodes in the past 1-2 years.  . Diverticulitis   . Endometriosis   . GERD (gastroesophageal reflux disease)   . Indigestion 02/13/2014  . PONV (postoperative nausea and vomiting)     Past Surgical History:  Procedure Laterality Date  . ABDOMINAL HYSTERECTOMY     partial  . BREAST REDUCTION SURGERY    . CHOLECYSTECTOMY  1999  . COLONOSCOPY  07/05/2012   Procedure: COLONOSCOPY;  Surgeon: Danie Binder, MD;  Location: [facility];  Service: Endoscopy;  Laterality: N/A;  9:30  . COLONOSCOPY N/A 05/07/2017   Procedure: COLONOSCOPY;  Surgeon: Danie Binder, MD;  Location: [facility];  Service: Endoscopy;  Laterality: N/A;  1:45pm  . endometrial mass    . ESOPHAGOGASTRODUODENOSCOPY  05/10/11   Benson: mild narrowing GE junction, Savory dilation  . FOOT SURGERY     left  . PARTIAL COLECTOMY N/A 07/02/2017   Procedure: PARTIAL COLECTOMY;  Surgeon: Aviva Signs, MD;  Location: [facility];  Service: General;  Laterality: N/A;  . SALPINGOOPHORECTOMY     right  . SKIN SURGERY     back of left leg  . TONSILLECTOMY      Current Outpatient Medications  Medication Sig Dispense Refill  . ciprofloxacin (CIPRO) 500 MG tablet Take 1 tablet (500 mg total) by mouth 2 (two) times daily. 10 tablet 0  . EPIPEN 2-PAK 0.3 MG/0.3ML SOAJ injection Inject 0.3 mg into the muscle as needed (bee stings).     Marland Kitchen estradiol (VIVELLE-DOT)  0.0375 MG/24HR APPLY 1 PATCH TWO TIMES WEEKLY (Patient taking differently: Place 1 patch onto the skin 2 (two) times a week. ) 24 patch 0  . ibuprofen (ADVIL,MOTRIN) 800 MG tablet Take 1 tablet (800 mg total) by mouth 3 (three) times daily. (Patient taking differently: Take 800 mg by mouth 3 (three) times daily as needed (migraines). ) 21 tablet 0  . omeprazole (PRILOSEC) 20 MG  capsule Take 1 capsule (20 mg total) by mouth daily as needed. Indigestion. (Patient taking differently: Take 20 mg by mouth daily as needed (indigestion). Indigestion.) 30 capsule 11  . ondansetron (ZOFRAN-ODT) 4 MG disintegrating tablet Take 4 mg by mouth every 8 (eight) hours as needed for nausea or vomiting.      No current facility-administered medications for this visit.     Allergies as of 07/31/2017 - Review Complete 07/31/2017  Allergen Reaction Noted  . Bee venom Anaphylaxis 05/03/2017  . Codeine Nausea Only 02/17/2015    Family History  Problem Relation Age of Onset  . COPD Mother   . Hypertension Father   . Other Brother        paralyzed from waist down; fell out of deer stand  . Other Daughter        born with 2 holes in heart  . Heart disease Maternal Grandfather   . COPD Maternal Grandfather   . Stroke Paternal Grandmother   . Stroke Paternal Grandfather   . Colon cancer Neg Hx     Social History   Socioeconomic History  . Marital status: Married    Spouse name: None  . Number of children: 2  . Years of education: None  . Highest education level: None  Social Needs  . Financial resource strain: None  . Food insecurity - worry: None  . Food insecurity - inability: None  . Transportation needs - medical: None  . Transportation needs - non-medical: None  Occupational History  . Occupation: Software engineer, [employer], [employer]  Tobacco Use  . Smoking status: Never Smoker  . Smokeless tobacco: Never Used  Substance and Sexual Activity  . Alcohol use: Yes    Comment: once a week  . Drug use: No  . Sexual activity: Yes    Partners: Male    Birth control/protection: Surgical    Comment: hyst  Other Topics Concern  . None  Social History Narrative   Lives w/ husband    Review of Systems: General: Negative for anorexia, weight loss, fever, chills, fatigue, weakness. Eyes: Negative for vision changes.  ENT: Negative for hoarseness, difficulty swallowing ,  nasal congestion. CV: Negative for chest pain, angina, palpitations, dyspnea on exertion, peripheral edema.  Respiratory: Negative for dyspnea at rest, dyspnea on exertion, cough, sputum, wheezing.  GI: See history of present illness. GU:  Negative for dysuria, hematuria, urinary incontinence, urinary frequency, nocturnal urination.  MS: Negative for joint pain, low back pain.  Derm: Negative for rash or itching.  Neuro: Negative for weakness, abnormal sensation, seizure, frequent headaches, memory loss, confusion.  Psych: Negative for anxiety, depression, suicidal ideation, hallucinations.  Endo: Negative for unusual weight change.  Heme: Negative for bruising or bleeding. Allergy: Negative for rash or hives.   Physical Exam: BP 124/80   Pulse 67   Temp (!) 97 F (36.1 C) (Oral)   Ht 5' 2.5" (1.588 m)   Wt 182 lb 9.6 oz (82.8 kg)   BMI 32.87 kg/m  General:   Alert and oriented. Pleasant and cooperative. Well-nourished and well-developed.  Head:  Normocephalic and atraumatic. Eyes:  Without icterus, sclera clear and conjunctiva pink.  Ears:  Normal auditory acuity. Mouth:  No deformity or lesions, oral mucosa pink.  Throat/Neck:  Supple, without mass or thyromegaly. Cardiovascular:  S1, S2 present without murmurs appreciated. Normal pulses noted. Extremities without clubbing or edema. Respiratory:  Clear to auscultation bilaterally. No wheezes, rales, or rhonchi. No distress.  Gastrointestinal:  +BS, soft, non-tender and non-distended. No HSM noted. No guarding or rebound. No masses appreciated.  Rectal:  Deferred  Musculoskalatal:  Symmetrical without gross deformities. Normal posture. Skin:  Intact without significant lesions or rashes. Neurologic:  Alert and oriented x4;  grossly normal neurologically. Psych:  Alert and cooperative. Normal mood and affect. Heme/Lymph/Immune: No significant cervical adenopathy. No excessive bruising noted.    07/31/2017 9:17  AM   Disclaimer: This note was dictated with voice recognition software. Similar sounding words can inadvertently be transcribed and may not be corrected upon review.

## 2017-07-31 NOTE — Assessment & Plan Note (Signed)
History of multiple episodes of sigmoid diverticulitis.  She recently underwent partial colectomy and is doing very well postoperatively.  She denies any surgical pain at this time.  She has a follow-up with the surgeon today at which point she anticipates being released.  Recommend she continue her current medications follow-up in 3 months.

## 2017-08-22 ENCOUNTER — Other Ambulatory Visit: Payer: Self-pay | Admitting: Adult Health

## 2017-10-31 ENCOUNTER — Ambulatory Visit: Payer: 59 | Admitting: Nurse Practitioner

## 2017-11-19 ENCOUNTER — Other Ambulatory Visit: Payer: Self-pay | Admitting: Adult Health

## 2018-01-30 ENCOUNTER — Encounter: Payer: Self-pay | Admitting: Nurse Practitioner

## 2018-01-30 ENCOUNTER — Ambulatory Visit (INDEPENDENT_AMBULATORY_CARE_PROVIDER_SITE_OTHER): Payer: 59 | Admitting: Nurse Practitioner

## 2018-01-30 VITALS — BP 139/78 | HR 65 | Temp 96.9°F | Ht 62.5 in | Wt 186.6 lb

## 2018-01-30 DIAGNOSIS — R1310 Dysphagia, unspecified: Secondary | ICD-10-CM | POA: Diagnosis not present

## 2018-01-30 DIAGNOSIS — K219 Gastro-esophageal reflux disease without esophagitis: Secondary | ICD-10-CM

## 2018-01-30 DIAGNOSIS — K5732 Diverticulitis of large intestine without perforation or abscess without bleeding: Secondary | ICD-10-CM

## 2018-01-30 DIAGNOSIS — R1319 Other dysphagia: Secondary | ICD-10-CM

## 2018-01-30 NOTE — Assessment & Plan Note (Signed)
Still doing well postop, stools are more solidified than prior to surgery.  However, her stools are not overtly solid.  She is okay with this.  Recommend continue care as directed by surgery, follow-up for any issues as needed.

## 2018-01-30 NOTE — Assessment & Plan Note (Signed)
Patient has a history of dysphasia.  She had an EGD with dilation in 2012 and her symptoms were frequent and bothersome.  Currently her symptoms are bothersome, but not very frequent about twice a month.  I offered EGD with possible dilation but due to work issues that she needs to wait 2 to 3 months.  We will have her follow-up in 3 months to discuss scheduling of endoscopy with dilation.  In the meantime I will give her dysphagia 3 diet recommendations.  Continue current medications.

## 2018-01-30 NOTE — Assessment & Plan Note (Signed)
The patient has a chronic history of GERD and she used to be PPI dependent.  Currently her symptoms are well managed on PPI as needed.  Recommend return for follow-up in 3 months.

## 2018-01-30 NOTE — Progress Notes (Signed)
CC'ED TO PCP 

## 2018-01-30 NOTE — Patient Instructions (Addendum)
1. Continue your current medications. 2. I am giving you diet recommendations to help with your difficulty swallowing below. 3. Return for follow-up in 3 months to discuss possibly repeating your upper endoscopy. 4. Call us if you have any questions or concerns.  At [facility] we value your feedback. You may receive a survey about your visit today. Please share your experience as we strive to create trusting relationships with our patients to provide genuine, compassionate, quality care.  We appreciate your understanding and patience as we review any laboratory studies, imaging, and other diagnostic tests that are ordered as we care for you. Our office policy is 5 business days for review of these results, and any emergent or urgent results are addressed in a timely manner for your best interest. If you do not hear from our office in 1 week, please contact us.   We also encourage the use of MyChart, which contains your medical information for your review as well. If you are not enrolled in this feature, an access code is on this after visit summary for your convenience. Thank you for allowing [place] to be involved in your care.  It was great to see you today!  I hope you have a great summer!!     Dysphagia Diet Level 3, Mechanically Advanced The dysphagia level 3 diet includes foods that are soft, moist, and can be chopped into 1-inch chunks. This diet is helpful for people with mild swallowing difficulties. It reduces the risk of food getting caught in the windpipe, trachea, or lungs. What do I need to know about this diet?  You may eat foods that are soft and moist.  If you were on the dysphagia level 1 or level 2 diets, you may eat any of the foods included on those lists.  Avoid foods that are dry, hard, sticky, chewy, coarse, and crunchy. Also avoid large cuts of food.  Take small bites. Each bite should contain 1 inch or less of food.  Thicken liquids if instructed by  your health care provider. Follow your health care provider's instructions on how to do this and to what consistency.  See your dietitian or speech language pathologist regularly for help with your dietary changes. What foods can I eat? Grains Moist breads without nuts or seeds. Biscuits, muffins, pancakes, and waffles well-moistened with syrup, jelly, margarine, or butter. Smooth cereals with plenty of milk to moisten them. Moist bread stuffing. Moist rice. Vegetables All cooked, soft vegetables. Shredded lettuce. Tender fried potatoes. Fruits All canned and cooked fruits. Soft, peeled fresh fruits, such as peaches, nectarines, kiwis, cantaloupe, honeydew melon, and watermelon without seeds. Soft berries, such as strawberries. Meat and Other Protein Sources Moist ground or finely diced or sliced meats. Solid, tender cuts of meat. Meatloaf. Hamburger with a bun. Sausage patty. Deli thin-sliced lunch meat. Chicken, egg, or tuna salad sandwich. Sloppy joe. Moist fish. Eggs prepared any way. Casseroles with small chunks of meats, ground meats, or tender meats. Dairy Cheese spreads without coarse large chunks. Shredded cheese. Cheese slices. Cottage cheese. Milk at the right texture. Smooth frappes. Yogurt without nuts or coconut. Ask your health care provider whether you can have frozen desserts (such as malts or milk shakes) and thin liquids. Sweets/Desserts Soft, smooth, moist desserts. Non-chewy, smooth candy. Jam. Jelly. Honey. Preserves. Ask your health care provider whether you can have frozen desserts. Fats and Oils Butter. Oils. Margarine. Mayonnaise. Gravy. Spreads. Other All seasonings and sweeteners. All sauces without large chunks. The items  listed above may not be a complete list of recommended foods or beverages. Contact your dietitian for more options. What foods are not recommended? Grains Coarse or dry cereals. Dry breads. Toast. Crackers. Tough, crusty breads, such French bread  and baguettes. Tough, crisp fried potatoes. Potato skins. Dry bread stuffing. Granola. Popcorn. Chips. Vegetables All raw vegetables except shredded lettuce. Cooked corn. Rubbery or stiff cooked vegetables. Stringy vegetables, such as celery. Fruits Hard fruits that are difficult to chew, such as apples or pears. Stringy, high-pulp fruits, such as pineapple, papaya, or mango. Fruits with tough skins, such as grapes. Coconut. All dried fruits. Fruit leather. Fruit roll-ups. Fruit snacks. Meat and Other Protein Sources Dry or tough meats or poultry. Dry fish. Fish with bones. Peanut butter. All nuts and seeds. Dairy Any with nuts, seeds, chocolate chips, dried fruit, coconut, or pineapple. Sweets/Desserts Dry cakes. Chewy or dry cookies. Any with nuts, seeds, dry fruits, coconut, pineapple, or anything dry, sticky, or hard. Chewy caramel. Licorice. Taffy-type candies. Ask your health care provider whether you can have frozen desserts. Fats and Oils Any with chunks, nuts, seeds, or pineapple. Olives. Angie Fava. Other Soups with tough or large chunks of meats, poultry, or vegetables. Corn or clam chowder. The items listed above may not be a complete list of foods and beverages to avoid. Contact your dietitian for more information. This information is not intended to replace advice given to you by your health care provider. Make sure you discuss any questions you have with your health care provider. Document Released: 05/22/2005 Document Revised: 10/28/2015 Document Reviewed: 05/05/2013 Elsevier Interactive Patient Education  [employer].

## 2018-01-30 NOTE — Progress Notes (Signed)
Referring Provider: Rory Percy, MD Primary Care Physician:  Rory Percy, MD Primary GI:  Dr. Oneida Alar  Chief Complaint  Patient presents with  . Dysphagia    occ    HPI:   Denise Taylor is a 57 y.o. female who presents for follow-up.  Patient was last seen in our office 07/31/2017 for diverticulitis and dysphasia.  He also has a history of chronic constipation.  Had multiple flares of diverticulitis.  Colonoscopy up-to-date 05/07/2017 which found diverticulosis and recommended surgical referral for possible sigmoid resection due to multiple episode of diverticulitis, repeat colonoscopy in 5 to 10 years.  She did undergo partial colectomy 07/02/2017 and no dysplasia or malignancy was found on surgical pathology.  She did well postop.  At her last visit he was doing well overall with minimal to no postop pain, bowel movements a bit soft but not bothersome and no diarrhea.  Rare dysphagia with a history of esophageal dilation.  No other GI symptoms.  Recommended continue current medications, follow-up with surgery as recommended, follow-up in 3 months here.  Last EGD in 2012 by Dr. Britta Mccreedy at [facility] with a narrowed GE junction s/p dilation.  Today she states she's doing well overall. Doing well post-op. Stools more formed than previous to surgery, but still not fully formed. Some "difficulty swallowing" about twice a month. Has dysphagia while eating, has symptoms with carbonated beverages. Before her last EGD she was having frequent symptoms. Currently her symptoms are overwhelming when they happen, but not often. She is wanting to hold off on EGD for now due to work issues, may re-approach when things setting down after 2 months. Denies abdominal pain, N/V, hematochezia, melena. Denies chest pain, dyspnea, dizziness, lightheadedness, syncope, near syncope. Denies any other upper or lower GI symptoms.  GERD well managed on Prilosec prn.  Past Medical History:  Diagnosis  Date  . Complication of anesthesia   . Current use of estrogen therapy 02/13/2014  . Diverticulitis    4 episodes in the past 1-2 years.  . Diverticulitis   . Endometriosis   . GERD (gastroesophageal reflux disease)   . Indigestion 02/13/2014  . PONV (postoperative nausea and vomiting)     Past Surgical History:  Procedure Laterality Date  . ABDOMINAL HYSTERECTOMY     partial  . BREAST REDUCTION SURGERY    . CHOLECYSTECTOMY  1999  . COLONOSCOPY  07/05/2012   Procedure: COLONOSCOPY;  Surgeon: Danie Binder, MD;  [facility];  Service: Endoscopy;  Laterality: N/A;  9:30  . COLONOSCOPY N/A 05/07/2017   Procedure: COLONOSCOPY;  Surgeon: Danie Binder, MD;  Location: [facility];  Service: Endoscopy;  Laterality: N/A;  1:45pm  . endometrial mass    . ESOPHAGOGASTRODUODENOSCOPY  05/10/11   Benson: mild narrowing GE junction, Savory dilation  . FOOT SURGERY     left  . PARTIAL COLECTOMY N/A 07/02/2017   Procedure: PARTIAL COLECTOMY;  Surgeon: Aviva Signs, MD;  Location: [facility];  Service: General;  Laterality: N/A;  . SALPINGOOPHORECTOMY     right  . SKIN SURGERY     back of left leg  . TONSILLECTOMY      Current Outpatient Medications  Medication Sig Dispense Refill  . EPIPEN 2-PAK 0.3 MG/0.3ML SOAJ injection Inject 0.3 mg into the muscle as needed (bee stings).     Marland Kitchen estradiol (VIVELLE-DOT) 0.0375 MG/24HR APPLY 1 PATCH TWO TIMES WEEKLY 24 patch 3  . ibuprofen (ADVIL,MOTRIN) 800 MG tablet Take 1  tablet (800 mg total) by mouth 3 (three) times daily. (Patient taking differently: Take 800 mg by mouth 3 (three) times daily as needed (migraines). ) 21 tablet 0  . omeprazole (PRILOSEC) 20 MG capsule Take 1 capsule (20 mg total) by mouth daily as needed. Indigestion. (Patient taking differently: Take 20 mg by mouth daily as needed (indigestion). Indigestion.) 30 capsule 11  . ondansetron (ZOFRAN-ODT) 4 MG disintegrating tablet Take 4 mg by mouth every 8 (eight) hours as  needed for nausea or vomiting.      No current facility-administered medications for this visit.     Allergies as of 01/30/2018 - Review Complete 01/30/2018  Allergen Reaction Noted  . Bee venom Anaphylaxis 05/03/2017  . Codeine Nausea Only 02/17/2015    Family History  Problem Relation Age of Onset  . COPD Mother   . Hypertension Father   . Other Brother        paralyzed from waist down; fell out of deer stand  . Other Daughter        born with 2 holes in heart  . Heart disease Maternal Grandfather   . COPD Maternal Grandfather   . Stroke Paternal Grandmother   . Stroke Paternal Grandfather   . Colon cancer Neg Hx     Social History   Socioeconomic History  . Marital status: Married    Spouse name: Not on file  . Number of children: 2  . Years of education: Not on file  . Highest education level: Not on file  Occupational History  . Occupation: Software engineer, [employer], [employer]  Social Needs  . Financial resource strain: Not on file  . Food insecurity:    Worry: Not on file    Inability: Not on file  . Transportation needs:    Medical: Not on file    Non-medical: Not on file  Tobacco Use  . Smoking status: Never Smoker  . Smokeless tobacco: Never Used  Substance and Sexual Activity  . Alcohol use: Yes    Comment: twice a week  . Drug use: No  . Sexual activity: Yes    Partners: Male    Birth control/protection: Surgical    Comment: hyst  Lifestyle  . Physical activity:    Days per week: Not on file    Minutes per session: Not on file  . Stress: Not on file  Relationships  . Social connections:    Talks on phone: Not on file    Gets together: Not on file    Attends religious service: Not on file    Active member of club or organization: Not on file    Attends meetings of clubs or organizations: Not on file    Relationship status: Not on file  Other Topics Concern  . Not on file  Social History Narrative   Lives w/ husband    Review of  Systems: Complete ROS negative except as per HPI.   Physical Exam: BP 139/78   Pulse 65   Temp (!) 96.9 F (36.1 C) (Oral)   Ht 5' 2.5" (1.588 m)   Wt 186 lb 9.6 oz (84.6 kg)   BMI 33.59 kg/m  General:   Alert and oriented. Pleasant and cooperative. Well-nourished and well-developed.  Eyes:  Without icterus, sclera clear and conjunctiva pink.  Ears:  Normal auditory acuity. Cardiovascular:  S1, S2 present without murmurs appreciated. Extremities without clubbing or edema. Respiratory:  Clear to auscultation bilaterally. No wheezes, rales, or rhonchi. No distress.  Gastrointestinal:  +  BS, soft, non-tender and non-distended. No HSM noted. No guarding or rebound. No masses appreciated.  Rectal:  Deferred  Musculoskalatal:  Symmetrical without gross deformities. Neurologic:  Alert and oriented x4;  grossly normal neurologically. Psych:  Alert and cooperative. Normal mood and affect. Heme/Lymph/Immune: No excessive bruising noted.    01/30/2018 10:57 AM   Disclaimer: This note was dictated with voice recognition software. Similar sounding words can inadvertently be transcribed and may not be corrected upon review.

## 2018-04-16 DIAGNOSIS — Z6833 Body mass index (BMI) 33.0-33.9, adult: Secondary | ICD-10-CM | POA: Diagnosis not present

## 2018-04-16 DIAGNOSIS — M5430 Sciatica, unspecified side: Secondary | ICD-10-CM | POA: Diagnosis not present

## 2018-05-07 ENCOUNTER — Ambulatory Visit: Payer: 59 | Admitting: Nurse Practitioner

## 2018-05-31 DIAGNOSIS — J189 Pneumonia, unspecified organism: Secondary | ICD-10-CM | POA: Diagnosis not present

## 2018-05-31 DIAGNOSIS — Z6832 Body mass index (BMI) 32.0-32.9, adult: Secondary | ICD-10-CM | POA: Diagnosis not present

## 2018-05-31 DIAGNOSIS — R05 Cough: Secondary | ICD-10-CM | POA: Diagnosis not present

## 2018-05-31 DIAGNOSIS — R509 Fever, unspecified: Secondary | ICD-10-CM | POA: Diagnosis not present

## 2018-06-13 DIAGNOSIS — J189 Pneumonia, unspecified organism: Secondary | ICD-10-CM | POA: Diagnosis not present

## 2018-06-13 DIAGNOSIS — Z6832 Body mass index (BMI) 32.0-32.9, adult: Secondary | ICD-10-CM | POA: Diagnosis not present

## 2018-06-28 DIAGNOSIS — J189 Pneumonia, unspecified organism: Secondary | ICD-10-CM | POA: Diagnosis not present

## 2018-06-28 DIAGNOSIS — Z6833 Body mass index (BMI) 33.0-33.9, adult: Secondary | ICD-10-CM | POA: Diagnosis not present

## 2018-07-24 DIAGNOSIS — J209 Acute bronchitis, unspecified: Secondary | ICD-10-CM | POA: Diagnosis not present

## 2018-08-29 DIAGNOSIS — R1032 Left lower quadrant pain: Secondary | ICD-10-CM | POA: Diagnosis not present

## 2018-08-29 DIAGNOSIS — K5792 Diverticulitis of intestine, part unspecified, without perforation or abscess without bleeding: Secondary | ICD-10-CM | POA: Diagnosis not present

## 2018-08-29 DIAGNOSIS — R1031 Right lower quadrant pain: Secondary | ICD-10-CM | POA: Diagnosis not present

## 2018-08-29 DIAGNOSIS — R1030 Lower abdominal pain, unspecified: Secondary | ICD-10-CM | POA: Diagnosis not present

## 2018-09-04 DIAGNOSIS — J45909 Unspecified asthma, uncomplicated: Secondary | ICD-10-CM | POA: Diagnosis not present

## 2019-01-20 ENCOUNTER — Other Ambulatory Visit: Payer: Self-pay | Admitting: Adult Health

## 2019-01-20 MED ORDER — ESTRADIOL 0.0375 MG/24HR TD PTTW: 1.0000 | MEDICATED_PATCH | TRANSDERMAL | 3 refills | Status: DC

## 2019-01-20 NOTE — Telephone Encounter (Signed)
Patient called, scheduled a pap/physical for November with Anderson Malta.  She is requested a refill on patches.   [facility] [place] [facility]  [phone]

## 2019-02-11 ENCOUNTER — Other Ambulatory Visit (HOSPITAL_COMMUNITY): Payer: Self-pay | Admitting: Respiratory Therapy

## 2019-02-11 DIAGNOSIS — R0602 Shortness of breath: Secondary | ICD-10-CM

## 2019-02-12 ENCOUNTER — Other Ambulatory Visit: Payer: Self-pay | Admitting: Family Medicine

## 2019-02-12 ENCOUNTER — Other Ambulatory Visit: Payer: Self-pay | Admitting: Physician Assistant

## 2019-02-12 ENCOUNTER — Other Ambulatory Visit (HOSPITAL_COMMUNITY): Payer: Self-pay | Admitting: Family Medicine

## 2019-02-12 ENCOUNTER — Other Ambulatory Visit: Payer: Self-pay

## 2019-02-12 DIAGNOSIS — R0602 Shortness of breath: Secondary | ICD-10-CM

## 2019-02-12 DIAGNOSIS — R059 Cough, unspecified: Secondary | ICD-10-CM

## 2019-02-12 DIAGNOSIS — R05 Cough: Secondary | ICD-10-CM

## 2019-02-18 ENCOUNTER — Other Ambulatory Visit: Payer: Self-pay

## 2019-02-18 ENCOUNTER — Other Ambulatory Visit (HOSPITAL_COMMUNITY)
Admission: RE | Admit: 2019-02-18 | Discharge: 2019-02-18 | Disposition: A | Discharge status: Home / Self Care | Payer: 59 | Source: Ambulatory Visit | Attending: Pulmonary Disease | Admitting: Pulmonary Disease

## 2019-02-18 DIAGNOSIS — Z20828 Contact with and (suspected) exposure to other viral communicable diseases: Secondary | ICD-10-CM | POA: Diagnosis not present

## 2019-02-18 DIAGNOSIS — Z01812 Encounter for preprocedural laboratory examination: Secondary | ICD-10-CM | POA: Insufficient documentation

## 2019-02-18 LAB — SARS CORONAVIRUS 2 (TAT 6-24 HRS): SARS Coronavirus 2: NEGATIVE

## 2019-02-19 ENCOUNTER — Ambulatory Visit (HOSPITAL_COMMUNITY)
Admission: RE | Admit: 2019-02-19 | Discharge: 2019-02-19 | Disposition: A | Discharge status: Home / Self Care | Payer: 59 | Source: Ambulatory Visit | Attending: Family Medicine | Admitting: Family Medicine

## 2019-02-19 ENCOUNTER — Other Ambulatory Visit: Payer: Self-pay

## 2019-02-19 DIAGNOSIS — R05 Cough: Secondary | ICD-10-CM | POA: Diagnosis present

## 2019-02-19 DIAGNOSIS — R0602 Shortness of breath: Secondary | ICD-10-CM | POA: Diagnosis present

## 2019-02-19 DIAGNOSIS — R059 Cough, unspecified: Secondary | ICD-10-CM

## 2019-02-20 ENCOUNTER — Ambulatory Visit (HOSPITAL_COMMUNITY)
Admission: RE | Admit: 2019-02-20 | Discharge: 2019-02-20 | Disposition: A | Discharge status: Home / Self Care | Payer: 59 | Source: Ambulatory Visit | Attending: Pulmonary Disease | Admitting: Pulmonary Disease

## 2019-02-20 DIAGNOSIS — R0602 Shortness of breath: Secondary | ICD-10-CM | POA: Insufficient documentation

## 2019-02-20 LAB — PULMONARY FUNCTION TEST
DL/VA % pred: 103 %
DL/VA: 4.43 ml/min/mmHg/L
DLCO unc % pred: 98 %
DLCO unc: 19.32 ml/min/mmHg
FEF 25-75 Post: 3.6 L/sec
FEF 25-75 Pre: 1.82 L/sec
FEF2575-%Change-Post: 97 %
FEF2575-%Pred-Post: 147 %
FEF2575-%Pred-Pre: 74 %
FEV1-%Change-Post: 35 %
FEV1-%Pred-Post: 99 %
FEV1-%Pred-Pre: 73 %
FEV1-Post: 2.51 L
FEV1-Pre: 1.86 L
FEV1FVC-%Change-Post: 2 %
FEV1FVC-%Pred-Pre: 101 %
FEV6-%Change-Post: 31 %
FEV6-%Pred-Post: 97 %
FEV6-%Pred-Pre: 73 %
FEV6-Post: 3.04 L
FEV6-Pre: 2.31 L
FEV6FVC-%Pred-Post: 103 %
FEV6FVC-%Pred-Pre: 103 %
FVC-%Change-Post: 31 %
FVC-%Pred-Post: 94 %
FVC-%Pred-Pre: 71 %
FVC-Post: 3.04 L
FVC-Pre: 2.31 L
Post FEV1/FVC ratio: 82 %
Post FEV6/FVC ratio: 100 %
Pre FEV1/FVC ratio: 80 %
Pre FEV6/FVC Ratio: 100 %
RV % pred: 99 %
RV: 1.82 L
TLC % pred: 100 %
TLC: 4.83 L

## 2019-02-20 MED ORDER — ALBUTEROL SULFATE (2.5 MG/3ML) 0.083% IN NEBU
2.5000 mg | INHALATION_SOLUTION | Freq: Once | RESPIRATORY_TRACT | Status: AC
  Administered 2019-02-20: 2.5 mg via RESPIRATORY_TRACT

## 2019-03-12 ENCOUNTER — Other Ambulatory Visit (HOSPITAL_COMMUNITY): Payer: Self-pay | Admitting: Family Medicine

## 2019-03-12 DIAGNOSIS — E041 Nontoxic single thyroid nodule: Secondary | ICD-10-CM

## 2019-03-17 ENCOUNTER — Other Ambulatory Visit: Payer: Self-pay

## 2019-03-17 ENCOUNTER — Ambulatory Visit (HOSPITAL_COMMUNITY)
Admission: RE | Admit: 2019-03-17 | Discharge: 2019-03-17 | Disposition: A | Discharge status: Home / Self Care | Payer: 59 | Source: Ambulatory Visit | Attending: Family Medicine | Admitting: Family Medicine

## 2019-03-17 DIAGNOSIS — E041 Nontoxic single thyroid nodule: Secondary | ICD-10-CM | POA: Insufficient documentation

## 2019-04-14 ENCOUNTER — Ambulatory Visit (INDEPENDENT_AMBULATORY_CARE_PROVIDER_SITE_OTHER): Payer: 59 | Admitting: Otolaryngology

## 2019-04-14 ENCOUNTER — Other Ambulatory Visit: Payer: Self-pay

## 2019-04-14 DIAGNOSIS — D440 Neoplasm of uncertain behavior of thyroid gland: Secondary | ICD-10-CM

## 2019-04-15 ENCOUNTER — Other Ambulatory Visit (HOSPITAL_COMMUNITY): Payer: Self-pay | Admitting: Otolaryngology

## 2019-04-15 DIAGNOSIS — E041 Nontoxic single thyroid nodule: Secondary | ICD-10-CM

## 2019-04-21 ENCOUNTER — Other Ambulatory Visit: Payer: Self-pay

## 2019-04-21 ENCOUNTER — Ambulatory Visit (INDEPENDENT_AMBULATORY_CARE_PROVIDER_SITE_OTHER): Payer: 59 | Admitting: Adult Health

## 2019-04-21 ENCOUNTER — Encounter: Payer: Self-pay | Admitting: Adult Health

## 2019-04-21 VITALS — BP 147/83 | HR 71 | Ht 62.5 in | Wt 196.5 lb

## 2019-04-21 DIAGNOSIS — Z79899 Other long term (current) drug therapy: Secondary | ICD-10-CM

## 2019-04-21 DIAGNOSIS — Z01419 Encounter for gynecological examination (general) (routine) without abnormal findings: Secondary | ICD-10-CM | POA: Diagnosis not present

## 2019-04-21 DIAGNOSIS — Z1211 Encounter for screening for malignant neoplasm of colon: Secondary | ICD-10-CM | POA: Diagnosis not present

## 2019-04-21 DIAGNOSIS — Z1212 Encounter for screening for malignant neoplasm of rectum: Secondary | ICD-10-CM | POA: Diagnosis not present

## 2019-04-21 DIAGNOSIS — Z79818 Long term (current) use of other agents affecting estrogen receptors and estrogen levels: Secondary | ICD-10-CM

## 2019-04-21 LAB — HEMOCCULT GUIAC POC 1CARD (OFFICE): Fecal Occult Blood, POC: NEGATIVE

## 2019-04-21 MED ORDER — ESTRADIOL 0.0375 MG/24HR TD PTTW: 1.0000 | MEDICATED_PATCH | TRANSDERMAL | 4 refills | Status: DC

## 2019-04-21 NOTE — Progress Notes (Signed)
Patient ID: Denise Taylor, female   DOB: [birth year], 58 y.o.   MRN: [id number] History of Present Illness: Denise Taylor is a 58 year old white female, married, sp hysterectomy in for a well woman gyn exam and refill estrogen patches.She had partial colectomy last year. PCP is Dr Nadara Mustard   Current Medications, Allergies, Past Medical History, Past Surgical History, Family History and Social History were reviewed in [place] record.     Review of Systems: Patient denies any headaches, hearing loss, fatigue, blurred vision, shortness of breath, chest pain, abdominal pain, problems with bowel movements, urination, or intercourse. No joint pain or mood swings.    Physical Exam:BP (!) 147/83 (BP Location: Left Arm, Patient Position: Sitting, Cuff Size: Normal)   Pulse 71   Ht 5' 2.5" (1.588 m)   Wt 196 lb 8 oz (89.1 kg)   BMI 35.37 kg/m  General:  Well developed, well nourished, no acute distress Skin:  Warm and dry Neck:  Midline trachea, +right thyroid nodule(had [place] to get biopsy 04/23/19) good ROM, no lymphadenopathy Lungs; Clear to auscultation bilaterally Breast:  No dominant palpable mass, retraction, or nipple discharge, has well healed scars  from reduction Cardiovascular: Regular rate and rhythm Abdomen:  Soft, non tender, no hepatosplenomegaly Pelvic:  External genitalia is normal in appearance, no lesions.  The vagina is normal in appearance. Urethra has no lesions or masses. The cervix and uterus are absent. No adnexal masses or tenderness noted.Bladder is non tender, no masses felt. Rectal: Good sphincter tone, no polyps, or hemorrhoids felt.  Hemoccult negative. Extremities/musculoskeletal:  No swelling or varicosities noted, no clubbing or cyanosis Psych:  No mood changes, alert and cooperative,seems happy Fall risk is moderate PHQ 2 score is 0. Examination chaperoned by Levy Pupa LPN  Impression and Plan: 1. Encounter for well woman exam with routine  gynecological exam Physical in 1 year Labs with PCP  [facility] at [facility]   2. Screening for colorectal cancer Colonoscopy per GI  3. Current use of estrogen therapy Meds ordered this encounter  Medications  . estradiol (VIVELLE-DOT) 0.0375 MG/24HR    Sig: Place 1 patch onto the skin 2 (two) times a week.    Dispense:  24 patch    Refill:  4    Order Specific Question:   Supervising Provider    Answer:   Tania Ade H [[id number]]

## 2019-04-23 ENCOUNTER — Ambulatory Visit (HOSPITAL_COMMUNITY): Admission: RE | Admit: 2019-04-23 | Payer: 59 | Source: Ambulatory Visit

## 2019-04-23 ENCOUNTER — Encounter (HOSPITAL_COMMUNITY): Payer: Self-pay

## 2019-04-28 ENCOUNTER — Other Ambulatory Visit: Payer: Self-pay | Admitting: Otolaryngology

## 2019-04-28 ENCOUNTER — Other Ambulatory Visit (HOSPITAL_COMMUNITY): Payer: Self-pay | Admitting: Otolaryngology

## 2019-04-28 DIAGNOSIS — D497 Neoplasm of unspecified behavior of endocrine glands and other parts of nervous system: Secondary | ICD-10-CM

## 2019-04-29 ENCOUNTER — Other Ambulatory Visit (HOSPITAL_COMMUNITY): Payer: Self-pay | Admitting: Otolaryngology

## 2019-04-29 DIAGNOSIS — D497 Neoplasm of unspecified behavior of endocrine glands and other parts of nervous system: Secondary | ICD-10-CM

## 2019-05-07 ENCOUNTER — Other Ambulatory Visit: Payer: Self-pay

## 2019-05-07 ENCOUNTER — Encounter (HOSPITAL_COMMUNITY)
Admission: RE | Admit: 2019-05-07 | Discharge: 2019-05-07 | Disposition: A | Discharge status: Home / Self Care | Payer: 59 | Source: Ambulatory Visit | Attending: Otolaryngology | Admitting: Otolaryngology

## 2019-05-07 ENCOUNTER — Encounter (HOSPITAL_COMMUNITY): Payer: Self-pay

## 2019-05-07 DIAGNOSIS — D497 Neoplasm of unspecified behavior of endocrine glands and other parts of nervous system: Secondary | ICD-10-CM | POA: Insufficient documentation

## 2019-05-07 MED ORDER — TECHNETIUM TC 99M SESTAMIBI GENERIC - CARDIOLITE
25.0000 | Freq: Once | INTRAVENOUS | Status: AC | PRN
  Administered 2019-05-07: 08:00:00 25 via INTRAVENOUS

## 2019-05-22 ENCOUNTER — Other Ambulatory Visit (HOSPITAL_COMMUNITY)
Admission: RE | Admit: 2019-05-22 | Discharge: 2019-05-22 | Disposition: A | Discharge status: Home / Self Care | Payer: Managed Care, Other (non HMO) | Source: Intra-hospital | Attending: Otolaryngology | Admitting: Otolaryngology

## 2019-05-22 DIAGNOSIS — D351 Benign neoplasm of parathyroid gland: Secondary | ICD-10-CM | POA: Diagnosis not present

## 2019-05-23 LAB — PTH, INTACT AND CALCIUM
Calcium, Total (PTH): 8.5 mg/dL — ABNORMAL LOW (ref 8.7–10.2)
PTH: 24 pg/mL (ref 15–65)

## 2019-06-18 ENCOUNTER — Other Ambulatory Visit: Payer: Self-pay | Admitting: Otolaryngology

## 2019-06-30 ENCOUNTER — Encounter (HOSPITAL_BASED_OUTPATIENT_CLINIC_OR_DEPARTMENT_OTHER): Payer: Self-pay | Admitting: Otolaryngology

## 2019-06-30 ENCOUNTER — Other Ambulatory Visit: Payer: Self-pay

## 2019-07-01 ENCOUNTER — Other Ambulatory Visit (HOSPITAL_COMMUNITY)
Admission: RE | Admit: 2019-07-01 | Discharge: 2019-07-01 | Disposition: A | Discharge status: Home / Self Care | Payer: No Typology Code available for payment source | Source: Ambulatory Visit | Attending: Otolaryngology | Admitting: Otolaryngology

## 2019-07-01 DIAGNOSIS — Z20822 Contact with and (suspected) exposure to covid-19: Secondary | ICD-10-CM | POA: Diagnosis not present

## 2019-07-01 DIAGNOSIS — Z01812 Encounter for preprocedural laboratory examination: Secondary | ICD-10-CM | POA: Diagnosis not present

## 2019-07-01 LAB — SARS CORONAVIRUS 2 (TAT 6-24 HRS): SARS Coronavirus 2: NEGATIVE

## 2019-07-03 NOTE — Anesthesia Preprocedure Evaluation (Addendum)
Anesthesia Evaluation  Patient identified by MRN, date of birth, ID band Patient awake    Reviewed: Allergy & Precautions, NPO status , Patient's Chart, lab work & pertinent test results  History of Anesthesia Complications (+) PONV and history of anesthetic complications  Airway Mallampati: II  TM Distance: >3 FB Neck ROM: Full    Dental no notable dental hx. (+) Teeth Intact, Dental Advisory Given   Pulmonary neg pulmonary ROS,    Pulmonary exam normal breath sounds clear to auscultation       Cardiovascular Exercise Tolerance: Good negative cardio ROS Normal cardiovascular exam Rhythm:Regular Rate:Normal     Neuro/Psych    GI/Hepatic Neg liver ROS, GERD  ,  Endo/Other    Renal/GU      Musculoskeletal negative musculoskeletal ROS (+)   Abdominal (+) + obese,   Peds  Hematology   Anesthesia Other Findings   Reproductive/Obstetrics negative OB ROS                            Anesthesia Physical Anesthesia Plan  ASA: II  Anesthesia Plan: General   Post-op Pain Management:    Induction: Intravenous  PONV Risk Score and Plan: 4 or greater and Treatment may vary due to age or medical condition, Ondansetron, Dexamethasone, Midazolam and Scopolamine patch - Pre-op  Airway Management Planned: Oral ETT  Additional Equipment: None  Intra-op Plan:   Post-operative Plan: Extubation in OR  Informed Consent: I have reviewed the patients History and Physical, chart, labs and discussed the procedure including the risks, benefits and alternatives for the proposed anesthesia with the patient or authorized representative who has indicated his/her understanding and acceptance.     Dental advisory given  Plan Discussed with: CRNA  Anesthesia Plan Comments:       Anesthesia Quick Evaluation

## 2019-07-04 ENCOUNTER — Ambulatory Visit (HOSPITAL_BASED_OUTPATIENT_CLINIC_OR_DEPARTMENT_OTHER)
Admission: RE | Admit: 2019-07-04 | Discharge: 2019-07-05 | Disposition: A | Discharge status: Home / Self Care | Payer: No Typology Code available for payment source | Attending: Otolaryngology | Admitting: Otolaryngology

## 2019-07-04 ENCOUNTER — Encounter (HOSPITAL_BASED_OUTPATIENT_CLINIC_OR_DEPARTMENT_OTHER)
Admission: RE | Disposition: A | Discharge status: Home / Self Care | Payer: Self-pay | Source: Home / Self Care | Attending: Otolaryngology

## 2019-07-04 ENCOUNTER — Ambulatory Visit (HOSPITAL_BASED_OUTPATIENT_CLINIC_OR_DEPARTMENT_OTHER): Payer: No Typology Code available for payment source | Admitting: Anesthesiology

## 2019-07-04 ENCOUNTER — Other Ambulatory Visit: Payer: Self-pay

## 2019-07-04 ENCOUNTER — Encounter (HOSPITAL_BASED_OUTPATIENT_CLINIC_OR_DEPARTMENT_OTHER): Payer: Self-pay | Admitting: Otolaryngology

## 2019-07-04 DIAGNOSIS — E669 Obesity, unspecified: Secondary | ICD-10-CM | POA: Insufficient documentation

## 2019-07-04 DIAGNOSIS — E890 Postprocedural hypothyroidism: Secondary | ICD-10-CM

## 2019-07-04 DIAGNOSIS — Z9889 Other specified postprocedural states: Secondary | ICD-10-CM

## 2019-07-04 DIAGNOSIS — Z6834 Body mass index (BMI) 34.0-34.9, adult: Secondary | ICD-10-CM | POA: Diagnosis not present

## 2019-07-04 DIAGNOSIS — K219 Gastro-esophageal reflux disease without esophagitis: Secondary | ICD-10-CM | POA: Insufficient documentation

## 2019-07-04 DIAGNOSIS — D351 Benign neoplasm of parathyroid gland: Secondary | ICD-10-CM | POA: Diagnosis not present

## 2019-07-04 HISTORY — DX: Benign neoplasm of parathyroid gland: D35.1

## 2019-07-04 HISTORY — PX: THYROIDECTOMY: SHX17

## 2019-07-04 SURGERY — THYROIDECTOMY
Anesthesia: General | Site: Neck | Laterality: Right

## 2019-07-04 MED ORDER — ALBUTEROL SULFATE HFA 108 (90 BASE) MCG/ACT IN AERS: 2.0000 | INHALATION_SPRAY | Freq: Four times a day (QID) | RESPIRATORY_TRACT | Status: DC | PRN

## 2019-07-04 MED ORDER — FENTANYL CITRATE (PF) 100 MCG/2ML IJ SOLN: 50.0000 ug | INTRAMUSCULAR | Status: DC | PRN

## 2019-07-04 MED ORDER — ACETAMINOPHEN 10 MG/ML IV SOLN
INTRAVENOUS | Status: AC
  Filled 2019-07-04: qty 100

## 2019-07-04 MED ORDER — PROPOFOL 10 MG/ML IV BOLUS
INTRAVENOUS | Status: DC | PRN
  Administered 2019-07-04: 80 mg via INTRAVENOUS
  Administered 2019-07-04: 120 mg via INTRAVENOUS

## 2019-07-04 MED ORDER — MIDAZOLAM HCL 2 MG/2ML IJ SOLN
INTRAMUSCULAR | Status: AC
  Filled 2019-07-04: qty 2

## 2019-07-04 MED ORDER — KETOROLAC TROMETHAMINE 30 MG/ML IJ SOLN
INTRAMUSCULAR | Status: AC
  Filled 2019-07-04: qty 1

## 2019-07-04 MED ORDER — EPHEDRINE 5 MG/ML INJ
INTRAVENOUS | Status: AC
  Filled 2019-07-04: qty 10

## 2019-07-04 MED ORDER — PANTOPRAZOLE SODIUM 40 MG PO TBEC: 40.0000 mg | DELAYED_RELEASE_TABLET | Freq: Every day | ORAL | Status: DC

## 2019-07-04 MED ORDER — LIDOCAINE 2% (20 MG/ML) 5 ML SYRINGE
INTRAMUSCULAR | Status: AC
  Filled 2019-07-04: qty 5

## 2019-07-04 MED ORDER — ONDANSETRON HCL 4 MG/2ML IJ SOLN: 4.0000 mg | Freq: Once | INTRAMUSCULAR | Status: DC | PRN

## 2019-07-04 MED ORDER — DEXAMETHASONE SODIUM PHOSPHATE 10 MG/ML IJ SOLN
INTRAMUSCULAR | Status: DC | PRN
  Administered 2019-07-04: 10 mg via INTRAVENOUS

## 2019-07-04 MED ORDER — PROMETHAZINE HCL 25 MG/ML IJ SOLN
INTRAMUSCULAR | Status: AC
  Filled 2019-07-04: qty 1

## 2019-07-04 MED ORDER — CEFAZOLIN SODIUM-DEXTROSE 2-3 GM-%(50ML) IV SOLR
INTRAVENOUS | Status: DC | PRN
  Administered 2019-07-04: 2 g via INTRAVENOUS

## 2019-07-04 MED ORDER — OXYCODONE HCL 5 MG PO TABS: 5.0000 mg | ORAL_TABLET | Freq: Once | ORAL | Status: DC | PRN

## 2019-07-04 MED ORDER — SUCCINYLCHOLINE CHLORIDE 200 MG/10ML IV SOSY
PREFILLED_SYRINGE | INTRAVENOUS | Status: DC | PRN
  Administered 2019-07-04: 80 mg via INTRAVENOUS

## 2019-07-04 MED ORDER — FENTANYL CITRATE (PF) 100 MCG/2ML IJ SOLN: 25.0000 ug | INTRAMUSCULAR | Status: DC | PRN

## 2019-07-04 MED ORDER — OXYCODONE-ACETAMINOPHEN 5-325 MG PO TABS
1.0000 | ORAL_TABLET | ORAL | Status: DC | PRN
  Administered 2019-07-04: 1 via ORAL
  Filled 2019-07-04: qty 1

## 2019-07-04 MED ORDER — DEXAMETHASONE SODIUM PHOSPHATE 10 MG/ML IJ SOLN
INTRAMUSCULAR | Status: AC
  Filled 2019-07-04: qty 1

## 2019-07-04 MED ORDER — ONDANSETRON HCL 4 MG/2ML IJ SOLN
INTRAMUSCULAR | Status: DC | PRN
  Administered 2019-07-04: 4 mg via INTRAVENOUS

## 2019-07-04 MED ORDER — ACETAMINOPHEN 10 MG/ML IV SOLN
1000.0000 mg | Freq: Once | INTRAVENOUS | Status: DC | PRN
  Administered 2019-07-04: 1000 mg via INTRAVENOUS

## 2019-07-04 MED ORDER — LIDOCAINE-EPINEPHRINE 1 %-1:100000 IJ SOLN
INTRAMUSCULAR | Status: DC | PRN
  Administered 2019-07-04: 3 mL

## 2019-07-04 MED ORDER — FENTANYL CITRATE (PF) 100 MCG/2ML IJ SOLN
INTRAMUSCULAR | Status: AC
  Filled 2019-07-04: qty 2

## 2019-07-04 MED ORDER — OXYCODONE-ACETAMINOPHEN 5-325 MG PO TABS: 1.0000 | ORAL_TABLET | ORAL | 0 refills | Status: AC | PRN

## 2019-07-04 MED ORDER — LIDOCAINE-EPINEPHRINE 1 %-1:100000 IJ SOLN
INTRAMUSCULAR | Status: AC
  Filled 2019-07-04: qty 1

## 2019-07-04 MED ORDER — MORPHINE SULFATE (PF) 4 MG/ML IV SOLN: 2.0000 mg | INTRAVENOUS | Status: DC | PRN

## 2019-07-04 MED ORDER — MEPERIDINE HCL 25 MG/ML IJ SOLN: 6.2500 mg | INTRAMUSCULAR | Status: DC | PRN

## 2019-07-04 MED ORDER — OXYCODONE HCL 5 MG/5ML PO SOLN: 5.0000 mg | Freq: Once | ORAL | Status: DC | PRN

## 2019-07-04 MED ORDER — ROCURONIUM BROMIDE 10 MG/ML (PF) SYRINGE
PREFILLED_SYRINGE | INTRAVENOUS | Status: AC
  Filled 2019-07-04: qty 10

## 2019-07-04 MED ORDER — SUCCINYLCHOLINE CHLORIDE 200 MG/10ML IV SOSY
PREFILLED_SYRINGE | INTRAVENOUS | Status: AC
  Filled 2019-07-04: qty 10

## 2019-07-04 MED ORDER — AMOXICILLIN 875 MG PO TABS: 875.0000 mg | ORAL_TABLET | Freq: Two times a day (BID) | ORAL | 0 refills | Status: AC

## 2019-07-04 MED ORDER — KCL IN DEXTROSE-NACL 20-5-0.45 MEQ/L-%-% IV SOLN
INTRAVENOUS | Status: DC
  Filled 2019-07-04: qty 1000

## 2019-07-04 MED ORDER — KETOROLAC TROMETHAMINE 30 MG/ML IJ SOLN
30.0000 mg | Freq: Once | INTRAMUSCULAR | Status: AC
  Administered 2019-07-04: 30 mg via INTRAVENOUS

## 2019-07-04 MED ORDER — FENTANYL CITRATE (PF) 100 MCG/2ML IJ SOLN
INTRAMUSCULAR | Status: DC | PRN
  Administered 2019-07-04: 100 ug via INTRAVENOUS
  Administered 2019-07-04 (×2): 50 ug via INTRAVENOUS

## 2019-07-04 MED ORDER — ONDANSETRON HCL 4 MG PO TABS: 4.0000 mg | ORAL_TABLET | Freq: Three times a day (TID) | ORAL | 2 refills | Status: AC | PRN

## 2019-07-04 MED ORDER — LACTATED RINGERS IV SOLN: INTRAVENOUS | Status: DC

## 2019-07-04 MED ORDER — SCOPOLAMINE 1 MG/3DAYS TD PT72
1.0000 | MEDICATED_PATCH | Freq: Once | TRANSDERMAL | Status: DC
  Administered 2019-07-04: 1.5 mg via TRANSDERMAL

## 2019-07-04 MED ORDER — DEXMEDETOMIDINE HCL IN NACL 200 MCG/50ML IV SOLN
INTRAVENOUS | Status: AC
  Filled 2019-07-04: qty 50

## 2019-07-04 MED ORDER — PROMETHAZINE HCL 25 MG/ML IJ SOLN
6.2500 mg | Freq: Once | INTRAMUSCULAR | Status: AC
  Administered 2019-07-04: 6.25 mg via INTRAVENOUS

## 2019-07-04 MED ORDER — MIDAZOLAM HCL 2 MG/2ML IJ SOLN: 1.0000 mg | INTRAMUSCULAR | Status: DC | PRN

## 2019-07-04 MED ORDER — MIDAZOLAM HCL 2 MG/2ML IJ SOLN
INTRAMUSCULAR | Status: DC | PRN
  Administered 2019-07-04: 2 mg via INTRAVENOUS

## 2019-07-04 MED ORDER — SCOPOLAMINE 1 MG/3DAYS TD PT72
MEDICATED_PATCH | TRANSDERMAL | Status: AC
  Filled 2019-07-04: qty 1

## 2019-07-04 MED ORDER — LIDOCAINE 2% (20 MG/ML) 5 ML SYRINGE
INTRAMUSCULAR | Status: DC | PRN
  Administered 2019-07-04: 100 mg via INTRAVENOUS

## 2019-07-04 MED ORDER — DEXMEDETOMIDINE HCL IN NACL 200 MCG/50ML IV SOLN
INTRAVENOUS | Status: DC | PRN
  Administered 2019-07-04 (×2): 8 ug via INTRAVENOUS
  Administered 2019-07-04: 4 ug via INTRAVENOUS

## 2019-07-04 MED ORDER — ONDANSETRON HCL 4 MG/2ML IJ SOLN
INTRAMUSCULAR | Status: AC
  Filled 2019-07-04: qty 2

## 2019-07-04 SURGICAL SUPPLY — 60 items
ATTRACTOMAT 16X20 MAGNETIC DRP (DRAPES) IMPLANT
BLADE CLIPPER SURG (BLADE) IMPLANT
BLADE SURG 10 STRL SS (BLADE) IMPLANT
BLADE SURG 15 STRL LF DISP TIS (BLADE) ×2 IMPLANT
BLADE SURG 15 STRL SS (BLADE) ×1
CANISTER SUCT 1200ML W/VALVE (MISCELLANEOUS) ×3 IMPLANT
CLIP VESOCCLUDE SM WIDE 6/CT (CLIP) IMPLANT
CORD BIPOLAR FORCEPS 12FT (ELECTRODE) ×3 IMPLANT
COVER BACK TABLE 60X90IN (DRAPES) ×3 IMPLANT
COVER MAYO STAND STRL (DRAPES) ×3 IMPLANT
COVER WAND RF STERILE (DRAPES) IMPLANT
DECANTER SPIKE VIAL GLASS SM (MISCELLANEOUS) ×3 IMPLANT
DERMABOND ADVANCED (GAUZE/BANDAGES/DRESSINGS) ×1
DERMABOND ADVANCED .7 DNX12 (GAUZE/BANDAGES/DRESSINGS) ×2 IMPLANT
DRAIN CHANNEL 10F 3/8 F FF (DRAIN) ×3 IMPLANT
DRAIN CHANNEL 7F FF FLAT (WOUND CARE) IMPLANT
DRAPE U-SHAPE 76X120 STRL (DRAPES) ×3 IMPLANT
ELECT COATED BLADE 2.86 ST (ELECTRODE) ×3 IMPLANT
ELECT REM PT RETURN 9FT ADLT (ELECTROSURGICAL) ×3
ELECTRODE REM PT RTRN 9FT ADLT (ELECTROSURGICAL) ×2 IMPLANT
EVACUATOR SILICONE 100CC (DRAIN) ×3 IMPLANT
FORCEPS BIPOLAR SPETZLER 8 1.0 (NEUROSURGERY SUPPLIES) ×3 IMPLANT
GAUZE 4X4 16PLY RFD (DISPOSABLE) ×6 IMPLANT
GAUZE SPONGE 4X4 12PLY STRL LF (GAUZE/BANDAGES/DRESSINGS) IMPLANT
GLOVE BIO SURGEON STRL SZ 6.5 (GLOVE) IMPLANT
GLOVE BIO SURGEON STRL SZ7.5 (GLOVE) ×3 IMPLANT
GLOVE BIOGEL PI IND STRL 7.0 (GLOVE) ×6 IMPLANT
GLOVE BIOGEL PI INDICATOR 7.0 (GLOVE) ×3
GLOVE SURG SS PI 7.0 STRL IVOR (GLOVE) ×9 IMPLANT
GOWN STRL REUS W/ TWL LRG LVL3 (GOWN DISPOSABLE) ×6 IMPLANT
GOWN STRL REUS W/TWL LRG LVL3 (GOWN DISPOSABLE) ×3
HEMOSTAT SURGICEL 2X14 (HEMOSTASIS) IMPLANT
NEEDLE HYPO 25X1 1.5 SAFETY (NEEDLE) ×3 IMPLANT
NS IRRIG 1000ML POUR BTL (IV SOLUTION) ×3 IMPLANT
PACK BASIN DAY SURGERY FS (CUSTOM PROCEDURE TRAY) ×3 IMPLANT
PENCIL SMOKE EVACUATOR (MISCELLANEOUS) ×3 IMPLANT
PIN SAFETY STERILE (MISCELLANEOUS) IMPLANT
PROBE NERVBE PRASS .33 (MISCELLANEOUS) ×3 IMPLANT
SHEARS HARMONIC 9CM CVD (BLADE) ×3 IMPLANT
SLEEVE SCD COMPRESS KNEE MED (MISCELLANEOUS) ×3 IMPLANT
SPONGE INTESTINAL PEANUT (DISPOSABLE) ×3 IMPLANT
STAPLER VISISTAT 35W (STAPLE) IMPLANT
SUT ETHILON 3 0 PS 1 (SUTURE) ×3 IMPLANT
SUT PROLENE 5 0 P 3 (SUTURE) IMPLANT
SUT SILK 2 0 SH (SUTURE) ×9 IMPLANT
SUT SILK 2 0 TIES 17X18 (SUTURE)
SUT SILK 2-0 18XBRD TIE BLK (SUTURE) IMPLANT
SUT SILK 3 0 TIES 17X18 (SUTURE) ×1
SUT SILK 3-0 18XBRD TIE BLK (SUTURE) ×2 IMPLANT
SUT VIC AB 3-0 FS2 27 (SUTURE) ×3 IMPLANT
SUT VICRYL 4-0 PS2 18IN ABS (SUTURE) ×3 IMPLANT
SYR BULB 3OZ (MISCELLANEOUS) ×3 IMPLANT
SYR CONTROL 10ML LL (SYRINGE) ×3 IMPLANT
TOWEL GREEN STERILE FF (TOWEL DISPOSABLE) ×6 IMPLANT
TRAY DSU PREP LF (CUSTOM PROCEDURE TRAY) ×3 IMPLANT
TUBE CONNECTING 20X1/4 (TUBING) ×3 IMPLANT
TUBE ENDOTRAC NIMS EMG 6MM (MISCELLANEOUS) IMPLANT
TUBE ENDOTRAC NIMS EMG 7MM (MISCELLANEOUS) ×3 IMPLANT
TUBE ENDOTRAC NIMS EMG 8MM (MISCELLANEOUS) IMPLANT
TUBE ENDOTRAC NIMS EMG 9MM (MISCELLANEOUS) IMPLANT

## 2019-07-04 NOTE — H&P (Signed)
Cc: Parathyroid adenoma  HPI: The patient is a 59 year old female who returns today for her follow-up evaluation. The patient was last seen 2 months ago.  At that time, she was noted to have a thyroid nodule on her chest CT scan.  Subsequent evaluation by the ridiologist showed that the mass was likely outside the thyroid gland.  A Sestamibi scan was recommended instead.  She was noted to have a positive parathyroid scan.  A right inferior parathyroid adenoma was noted.  The patient returns today reporting occasional hoarseness and dysphagia.  She has difficulty swallowing her large pills.  Her symptoms have been ongoing for several months.  The patient's recent parathyroid hormone and calcium levels were all normal. No other ENT, GI, or respiratory issue noted since the last visit.   Exam: General: Communicates without difficulty, well nourished, no acute distress. Head: Normocephalic, no evidence injury, no tenderness, facial buttresses intact without stepoff. Eyes: PERRL, EOMI. No scleral icterus, conjunctivae clear. Neuro: CN II exam reveals vision grossly intact.  No nystagmus at any point of gaze. Ears: Auricles well formed without lesions.  Ear canals are intact without mass or lesion.  No erythema or edema is appreciated.  The TMs are intact without fluid. Nose: External evaluation reveals normal support and skin without lesions.  Dorsum is intact.  Anterior rhinoscopy reveals healthy pink mucosa over anterior aspect of inferior turbinates and intact septum.  No purulence noted. Oral:  Oral cavity and oropharynx are intact, symmetric, without erythema or edema.  Mucosa is moist without lesions. Neck: Full range of motion without pain.  There is no significant lymphadenopathy.  No masses palpable.  Thyroid bed within normal limits to palpation.  Parotid glands and submandibular glands equal bilaterally without mass.  Trachea is midline. Neuro:  CN 2-12 grossly intact. Gait normal. Vestibular: No  nystagmus at any point of gaze. The cerebellar examination is unremarkable.   Procedure:  Flexible Fiberoptic Laryngoscopy Risks, benefits, and alternatives of flexible endoscopy were explained to the patient.  Specific mention was made of the risk of throat numbness with difficulty swallowing, possible bleeding from the nose and mouth, and pain from the procedure.  The patient gave oral consent to proceed.  The nasal cavities were decongested and anesthetised with a combination of oxymetazoline and 4% lidocaine solution.  The flexible scope was inserted into the right nasal cavity and advanced towards the nasopharynx.  Visualized mucosa over the turbinates and septum were as described above.  The nasopharynx was clear.  Oropharyngeal walls were symmetric and mobile without lesion, mass, or edema.  Hypopharynx was also without  lesion or edema.  Larynx was mobile without lesions. Supraglottic structures were free of edema, mass, and asymmetry.  True vocal folds were white without mass or lesion.  Base of tongue was within normal limits.   Assessment 1.  The patient has a right inferior parathyroid adenoma on her Sestamibi scan. However, with her normal PTH and calcium levels, this may be a false positive.  I discussed the results with the [place] radiologist. It is possible that the mass is a thyroid adenoma instead. 2.  Occasional hoarseness and dysphagia.  No anatomic abnormality is noted on today's laryngoscopy examination.   Plan  1.  The physical exam and laryngoscopy findings are reviewed with the patient.  2.  The parathyroid scan results are also discussed.  3.  The treatment options are extensively discussed. The options include conservative observation vs FNA vs surgical excision.  The risks,  benefits, alternatives and details of the procedures are reviewed with the patient.  Questions are invited and answered.  4.  The patient would like to proceed with the excision procedure.

## 2019-07-04 NOTE — Op Note (Signed)
DATE OF PROCEDURE:  07/04/2019                              OPERATIVE REPORT  SURGEON:  Leta Baptist, MD  PREOPERATIVE DIAGNOSES: 1. Right parathyroid mass.  POSTOPERATIVE DIAGNOSES: 1. Right thyroid mass.  PROCEDURE PERFORMED:  Right total thyroid lobectomy.  ANESTHESIA:  General endotracheal tube anesthesia.  COMPLICATIONS:  None.  ESTIMATED BLOOD LOSS:  14ml.  INDICATION FOR PROCEDURE:  Denise Taylor is a 59 y.o. female with a history of a thyroid nodule on her chest CT scan.  Subsequent evaluation by the radiologist showed that the mass was likely outside the thyroid gland.  A Sestamibi scan was recommended instead.  She was noted to have a positive parathyroid scan.  A right inferior parathyroid adenoma was noted.  However, with her normal PTH and calcium levels, this might be a false positive. The treatment options were extensively discussed. The options include conservative observation vs FNA vs surgical excision.  The risks, benefits, alternatives and details of the procedure were reviewed with the patient.  The patient would like to proceed with the excision procedure. The patient was having occasional hoarseness and dysphagia.  She has difficulty swallowing her large pills.   DESCRIPTION:  The patient was taken to the operating room and placed supine on the operating table.  General endotracheal tube anesthesia was administered by the anesthesiologist.  The patient was positioned and prepped and draped in a standard fashion for anterior neck surgery.  The recurrent laryngeal nerve monitoring system was functional throughout the case.  1% lidocaine with 1-100,000 epinephrine was infiltrated at the planned site of incision.  A lower neck transverse incision was made.  The incision was carried down to the level of the platysma muscles.  Superiorly based and inferiorly based subplatysmal flaps were elevated in the standard fashion.  The strap muscles were divided at midline and retracted  laterally, exposing the right thyroid lobe.  Careful dissection was performed to free the right thyroid lobe from the surrounding soft tissue.  At this time, the patient was noted to have a 2 cm right posterior thyroid mass.  The mass was part of the thyroid lobe.  No parathyroid adenoma was noted.  The right recurrent laryngeal nerve was identified and preserved.  The entire right thyroid lobe was then resected free and sent to the pathology department for permanent histologic identification.  The surgical site was copiously irrigated.  The strap muscles were reapproximated with 4-0 Vicryl sutures.  A #10 JP drain was placed.  The incision was closed in layers with 4-0 Vicryl and Dermabond.  The care of the patient was turned over to the anesthesiologist.  The patient was awakened from anesthesia without difficulty.  The patient was extubated and transferred to the recovery room in good condition.  OPERATIVE FINDINGS: A 2 cm right posterior thyroid mass.  SPECIMEN: Right thyroid lobe.  FOLLOWUP CARE:  The patient will be admitted for overnight observation.  Amadu Schlageter W Jamair Cato 07/04/2019 11:00 AM

## 2019-07-04 NOTE — Discharge Instructions (Addendum)
Thyroidectomy, Care After This sheet gives you information about how to care for yourself after your procedure. Your health care provider may also give you more specific instructions. If you have problems or questions, contact your health care provider. What can I expect after the procedure? After the procedure, it is common to have:  Mild pain in the neck or upper body, especially when swallowing.  A swollen neck.  A sore throat.  A weak or hoarse voice.  Slight tingling or numbness around your mouth, or in your fingers or toes. This may last for a day or two after surgery. This condition is caused by low levels of calcium. You may be given calcium supplements to treat it. Follow these instructions at home:  Medicines  Take over-the-counter and prescription medicines only as told by your health care provider.  Do not drive or use heavy machinery while taking prescription pain medicine.  Do not take medicines that contain aspirin and ibuprofen until your health care provider says that you can. These medicines can increase your risk of bleeding. Eating and drinking  Start slowly with eating. You may need to have only liquids and soft foods for a few days or as directed by your health care provider.  To prevent or treat constipation while you are taking prescription pain medicine, your health care provider may recommend that you: ? Drink enough fluid to keep your urine pale yellow. ? Take over-the-counter or prescription medicines. ? Eat foods that are high in fiber, such as fresh fruits and vegetables, whole grains, and beans. ? Limit foods that are high in fat and processed sugars, such as fried and sweet foods. Incision care  Follow instructions from your health care provider about how to take care of your incision. Make sure you: ? Wash your hands with soap and water before you change your bandage (dressing). If soap and water are not available, use hand sanitizer. ? Change your  dressing as told by your health care provider. ? Leave stitches (sutures), skin glue, or adhesive strips in place. These skin closures may need to stay in place for 2 weeks or longer. If adhesive strip edges start to loosen and curl up, you may trim the loose edges. Do not remove adhesive strips completely unless your health care provider tells you to do that.  Check your incision area every day for signs of infection. Check for: ? Redness, swelling, or pain. ? Fluid or blood. ? Warmth. ? Pus or a bad smell. Activity  For the first 10 days after the procedure or as instructed by your health care provider: ? Do not lift anything that is heavier than 10 lb (4.5 kg). ? Do not jog, swim, or do other strenuous exercises. ? Do not play contact sports.  Avoid sitting for a long time without moving. Get up to take short walks every 1-2 hours. This is needed to improve blood flow and breathing. Ask for help if you feel weak or unsteady.  Return to your normal activities as told by your health care provider. Ask your health care provider what activities are safe for you. General instructions  Do not use any products that contain nicotine or tobacco, such as cigarettes and e-cigarettes. These can delay healing after surgery. If you need help quitting, ask your health care provider.  Keep all follow-up visits as told by your health care provider. This is important. Your health care provider needs to monitor the calcium level in your blood to make   sure that it does not become low. Contact a health care provider if you:  Have a fever.  Have more redness, swelling, or pain around your incision area.  Have fluid or blood coming from your incision area.  Notice that your incision area feels warm to the touch.  Have pus or a bad smell coming from your incision area.  Have trouble talking.  Have nausea or vomiting for more than 2 days. Get help right away if you:  Have trouble breathing.  Have  trouble swallowing.  Develop a rash.  Develop a cough that gets worse.  Notice that your speech changes, or you have hoarseness that gets worse.  Develop numbness, tingling, or muscle spasms in the arms, hands, feet, or face. Summary  After the procedure, it is common to feel mild pain in the neck or upper body, especially when swallowing.  Take medicines as told by your health care provider. These include pain medicines and thyroid hormones, if required.  Follow instructions from your health care provider about how to take care of your incision. Watch for signs of infection.  Keep all follow-up visits as told by your health care provider. This is important. Your health care provider needs to monitor the calcium level in your blood to make sure that it does not become low.  Get help right away if you develop difficulty breathing, or numbness, tingling, or muscle spasms in the arms, hands, feet, or face. This information is not intended to replace advice given to you by your health care provider. Make sure you discuss any questions you have with your health care provider. Document Revised: 07/18/2018 Document Reviewed: 03/27/2017 Elsevier Patient Education  [place] Instructions  Activity: Get plenty of rest for the remainder of the day. A responsible individual must stay with you for 24 hours following the procedure.  For the next 24 hours, DO NOT: -Drive a car -Paediatric nurse -Drink alcoholic beverages -Take any medication unless instructed by your physician -Make any legal decisions or sign important papers.  Meals: Start with liquid foods such as gelatin or soup. Progress to regular foods as tolerated. Avoid greasy, spicy, heavy foods. If nausea and/or vomiting occur, drink only clear liquids until the nausea and/or vomiting subsides. Call your physician if vomiting continues.  Special Instructions/Symptoms: Your throat may feel  dry or sore from the anesthesia or the breathing tube placed in your throat during surgery. If this causes discomfort, gargle with warm salt water. The discomfort should disappear within 24 hours.  If you had a scopolamine patch placed behind your ear for the management of post- operative nausea and/or vomiting:  1. The medication in the patch is effective for 72 hours, after which it should be removed.  Wrap patch in a tissue and discard in the trash. Wash hands thoroughly with soap and water. 2. You may remove the patch earlier than 72 hours if you experience unpleasant side effects which may include dry mouth, dizziness or visual disturbances. 3. Avoid touching the patch. Wash your hands with soap and water after contact with the patch.

## 2019-07-04 NOTE — Anesthesia Postprocedure Evaluation (Signed)
Anesthesia Post Note  Patient: Denise Taylor  Procedure(s) Performed: HEMITHYROIDECTOMY (Right Neck)     Patient location during evaluation: PACU Anesthesia Type: General Level of consciousness: awake and alert Pain management: pain level controlled Vital Signs Assessment: post-procedure vital signs reviewed and stable Respiratory status: spontaneous breathing, nonlabored ventilation, respiratory function stable and patient connected to nasal cannula oxygen Cardiovascular status: blood pressure returned to baseline and stable Postop Assessment: no apparent nausea or vomiting Anesthetic complications: no    Last Vitals:  Vitals:   07/04/19 1200 07/04/19 1230  BP: (!) 145/81 (!) 144/72  Pulse: 63 (!) 56  Resp: 19 16  Temp:  (!) 36.3 C  SpO2: 95% 96%    Last Pain:  Vitals:   07/04/19 1230  TempSrc:   PainSc: 3                  Barnet Glasgow

## 2019-07-04 NOTE — Anesthesia Procedure Notes (Signed)
Procedure Name: Intubation Date/Time: 07/04/2019 9:07 AM Performed by: Genelle Bal, CRNA Pre-anesthesia Checklist: Patient identified, Emergency Drugs available, Suction available and Patient being monitored Patient Re-evaluated:Patient Re-evaluated prior to induction Oxygen Delivery Method: Circle system utilized Preoxygenation: Pre-oxygenation with 100% oxygen Induction Type: IV induction Ventilation: Mask ventilation without difficulty Laryngoscope Size: Mac and 3 Grade View: Grade I Tube type: Oral Number of attempts: 1 Airway Equipment and Method: Stylet Placement Confirmation: ETT inserted through vocal cords under direct vision,  positive ETCO2 and breath sounds checked- equal and bilateral Secured at: 20 cm Tube secured with: Tape Dental Injury: Teeth and Oropharynx as per pre-operative assessment

## 2019-07-04 NOTE — Transfer of Care (Signed)
Immediate Anesthesia Transfer of Care Note  Patient: [name]  Procedure(s) Performed: HEMITHYROIDECTOMY (Right Neck)  Patient Location: PACU  Anesthesia Type:General  Level of Consciousness: awake, alert  and oriented  Airway & Oxygen Therapy: Patient Spontanous Breathing and Patient connected to face mask oxygen  Post-op Assessment: Report given to RN and Post -op Vital signs reviewed and stable  Post vital signs: Reviewed and stable  Last Vitals:  Vitals Value Taken Time  BP 131/73   Temp    Pulse 85 07/04/19 1104  Resp 19 07/04/19 1104  SpO2 99 % 07/04/19 1104  Vitals shown include unvalidated device data.  Last Pain:  Vitals:   07/04/19 0748  TempSrc: Oral  PainSc: 0-No pain         Complications: No apparent anesthesia complications

## 2019-07-05 NOTE — Discharge Summary (Signed)
Physician Discharge Summary  Patient ID: Denise Taylor MRN: [id number] DOB/AGE: 58-May-1962 58 y.o.  Admit date: 07/04/2019 Discharge date: 07/05/2019  Admission Diagnoses: Right parathyroid mass  Discharge Diagnoses: Right thyroid mass Active Problems:   S/P partial thyroidectomy   Discharged Condition: good  Hospital Course: Pt had an uneventful overnight stay. Pt tolerated po well. No bleeding. No stridor. Voice is strong.  Consults: None  Significant Diagnostic Studies: None  Treatments: surgery: Right hemithyroidectomy  Discharge Exam: Blood pressure 116/67, pulse 69, temperature 98.4 F (36.9 C), resp. rate 16, height 5' 2.5" (1.588 m), weight 87.3 kg, SpO2 99 %. Incision/Wound:c/d/i Voice is strong  Disposition: Discharge disposition: 01-Home or Self Care       Discharge Instructions    Activity as tolerated - No restrictions   Complete by: As directed    Diet general   Complete by: As directed      Allergies as of 07/05/2019      Reactions   Bee Venom Anaphylaxis   Codeine Nausea Only      Medication List    STOP taking these medications   ibuprofen 800 MG tablet Commonly known as: ADVIL     TAKE these medications   albuterol 108 (90 Base) MCG/ACT inhaler Commonly known as: VENTOLIN HFA Inhale into the lungs every 6 (six) hours as needed for wheezing or shortness of breath.   amoxicillin 875 MG tablet Commonly known as: AMOXIL Take 1 tablet (875 mg total) by mouth 2 (two) times daily for 5 days.   EpiPen 2-Pak 0.3 mg/0.3 mL [place] injection Generic drug: EPINEPHrine Inject 0.3 mg into the muscle as needed (bee stings).   estradiol 0.0375 MG/24HR Commonly known as: VIVELLE-DOT Place 1 patch onto the skin 2 (two) times a week.   omeprazole 20 MG capsule Commonly known as: PRILOSEC Take 1 capsule (20 mg total) by mouth daily as needed. Indigestion. What changed: reasons to take this   ondansetron 4 MG tablet Commonly known as:  Zofran Take 1 tablet (4 mg total) by mouth every 8 (eight) hours as needed for nausea or vomiting.   oxyCODONE-acetaminophen 5-325 MG tablet Commonly known as: Percocet Take 1 tablet by mouth every 4 (four) hours as needed for up to 3 days for severe pain.      Follow-up Information    Leta Baptist, MD On 07/11/2019.   Specialty: Otolaryngology Why: at [employer] information: [place] [id number] [phone]           Signed: Burley Saver 07/05/2019, 8:08 AM

## 2019-07-07 ENCOUNTER — Encounter: Payer: Self-pay | Admitting: *Deleted

## 2019-07-07 LAB — SURGICAL PATHOLOGY

## 2019-08-27 ENCOUNTER — Encounter: Payer: Self-pay | Admitting: Dermatology

## 2019-08-27 ENCOUNTER — Other Ambulatory Visit: Payer: Self-pay

## 2019-08-27 ENCOUNTER — Ambulatory Visit (INDEPENDENT_AMBULATORY_CARE_PROVIDER_SITE_OTHER): Payer: 59 | Admitting: Dermatology

## 2019-08-27 DIAGNOSIS — L570 Actinic keratosis: Secondary | ICD-10-CM

## 2019-08-27 DIAGNOSIS — D225 Melanocytic nevi of trunk: Secondary | ICD-10-CM | POA: Diagnosis not present

## 2019-08-27 DIAGNOSIS — D1801 Hemangioma of skin and subcutaneous tissue: Secondary | ICD-10-CM

## 2019-08-27 DIAGNOSIS — L810 Postinflammatory hyperpigmentation: Secondary | ICD-10-CM

## 2019-08-27 DIAGNOSIS — D229 Melanocytic nevi, unspecified: Secondary | ICD-10-CM

## 2019-08-27 NOTE — Patient Instructions (Signed)
History of a spot several months ago becoming quite inflamed and uncomfortable in the left mid eyebrow; this persisted for several months and in recent weeks has essentially cleared leaving a small brown spot.  Physical examination shows a 3 mm monochrome tan macule compatible with postinflammatory hyperpigmentation.  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  If there is recurrent inflammation or crusting or bleeding, she will return for possible biopsy.  The rest of her head and neck as well as back examination is normal.  Multiple cherry angiomas on the back require no intervention.  Follow-up can be on a as needed basis.

## 2019-08-28 NOTE — Progress Notes (Signed)
   Follow-Up Visit   Subjective  Denise Taylor is a 59 y.o. female who presents for the following: Skin Problem (Check spot in left eyebrow x few months.  Per patient spot was raw and non healing, but it's now healed but still present.).  New lesion Location: Left eyebrow Duration: 2 to 3 months Quality: Inflamed and got crusty, now improved Associated Signs/Symptoms: Redness peeling Modifying Factors:  Severity:  Timing: Context:   The following portions of the chart were reviewed this encounter and updated as appropriate:     Objective  Well appearing patient in no apparent distress; mood and affect are within normal limits.  All sun exposed areas plus back examined. History of a spot several months ago becoming quite inflamed and uncomfortable in the left mid eyebrow; this persisted for several months and in recent weeks has essentially cleared leaving a small brown spot.  Physical examination shows a 3 mm monochrome tan macule compatible with postinflammatory hyperpigmentation.  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  If there is recurrent inflammation or crusting or bleeding, she will return for possible biopsy.  The rest of her head and neck as well as back examination is normal.  Multiple cherry angiomas on the back require no intervention.  Follow-up can be on a as needed basis.  Assessment & Plan  AK (actinic keratosis) Left Supraorbital Region  Nevus Right Upper Back

## 2019-08-31 NOTE — Addendum Note (Signed)
Addended by: Lavonna Monarch on: 08/31/2019 06:30 PM   Modules accepted: Level of Service

## 2019-10-13 ENCOUNTER — Encounter (HOSPITAL_COMMUNITY): Payer: Self-pay | Admitting: *Deleted

## 2019-10-13 ENCOUNTER — Emergency Department (HOSPITAL_COMMUNITY): Payer: No Typology Code available for payment source

## 2019-10-13 ENCOUNTER — Other Ambulatory Visit: Payer: Self-pay

## 2019-10-13 ENCOUNTER — Emergency Department (HOSPITAL_COMMUNITY)
Admission: EM | Admit: 2019-10-13 | Discharge: 2019-10-13 | Disposition: A | Discharge status: Home / Self Care | Payer: No Typology Code available for payment source | Attending: Emergency Medicine | Admitting: Emergency Medicine

## 2019-10-13 DIAGNOSIS — M25551 Pain in right hip: Secondary | ICD-10-CM | POA: Diagnosis not present

## 2019-10-13 DIAGNOSIS — Z79899 Other long term (current) drug therapy: Secondary | ICD-10-CM | POA: Insufficient documentation

## 2019-10-13 DIAGNOSIS — Y929 Unspecified place or not applicable: Secondary | ICD-10-CM | POA: Diagnosis not present

## 2019-10-13 DIAGNOSIS — Y990 Civilian activity done for income or pay: Secondary | ICD-10-CM | POA: Insufficient documentation

## 2019-10-13 DIAGNOSIS — M545 Low back pain, unspecified: Secondary | ICD-10-CM

## 2019-10-13 DIAGNOSIS — M542 Cervicalgia: Secondary | ICD-10-CM | POA: Diagnosis not present

## 2019-10-13 DIAGNOSIS — Y939 Activity, unspecified: Secondary | ICD-10-CM | POA: Insufficient documentation

## 2019-10-13 DIAGNOSIS — W010XXA Fall on same level from slipping, tripping and stumbling without subsequent striking against object, initial encounter: Secondary | ICD-10-CM | POA: Diagnosis not present

## 2019-10-13 DIAGNOSIS — W19XXXA Unspecified fall, initial encounter: Secondary | ICD-10-CM

## 2019-10-13 MED ORDER — IBUPROFEN 400 MG PO TABS
600.0000 mg | ORAL_TABLET | Freq: Once | ORAL | Status: AC
  Administered 2019-10-13: 20:00:00 600 mg via ORAL
  Filled 2019-10-13: qty 2

## 2019-10-13 MED ORDER — LIDOCAINE 5 % EX PTCH: 1.0000 | MEDICATED_PATCH | CUTANEOUS | 0 refills | Status: DC

## 2019-10-13 MED ORDER — METHOCARBAMOL 500 MG PO TABS: 500.0000 mg | ORAL_TABLET | Freq: Two times a day (BID) | ORAL | 0 refills | Status: DC

## 2019-10-13 NOTE — ED Triage Notes (Signed)
States she fell at work yesterday afternoon, c/o pain in neck, right shoulder and back, c/o tingling feeling in right lower leg

## 2019-10-13 NOTE — ED Provider Notes (Signed)
[facility] EMERGENCY DEPARTMENT Provider Note   CSN: [id number] Arrival date & time: 10/13/19  1544     History Chief Complaint  Patient presents with  . Fall    Denise Taylor is a 59 y.o. female.  HPI      Denise Taylor is a 59 y.o. female, presenting to the ED with a fall last night while at work.  Patient states she tripped over a rug and fell onto her right side. Currently complains of pain to the posterior neck, lower back, and right hip.  She states she will have intermittent tingling to the inner right calf. She initially had pain to the right arm, but states she no longer has pain in this region. Denies LOC, head injury, numbness, weakness, chest pain, shortness of breath, abdominal pain, changes in bowel or bladder function, saddle anesthesias, nausea/vomiting, or any other complaints.     Past Medical History:  Diagnosis Date  . Complication of anesthesia   . Current use of estrogen therapy 02/13/2014  . Diverticulitis    4 episodes in the past 1-2 years.  . Diverticulitis   . Endometriosis   . GERD (gastroesophageal reflux disease)   . Indigestion 02/13/2014  . Parathyroid adenoma   . PONV (postoperative nausea and vomiting)   . Reactive airway disease     Patient Active Problem List   Diagnosis Date Noted  . S/P partial thyroidectomy 07/04/2019  . Screening for colorectal cancer 04/21/2019  . Encounter for well woman exam with routine gynecological exam 04/21/2019  . GERD (gastroesophageal reflux disease) 01/30/2018  . Dysphagia 07/31/2017  . S/P partial colectomy 07/02/2017  . Diverticulitis of sigmoid colon   . Personal history of colonic polyps   . Diverticulitis of colon 05/02/2017  . Current use of estrogen therapy 02/13/2014  . Indigestion 02/13/2014  . LLQ pain 06/27/2012  . Encounter for screening colonoscopy 06/27/2012    Past Surgical History:  Procedure Laterality Date  . ABDOMINAL HYSTERECTOMY     partial  . BREAST REDUCTION  SURGERY    . CHOLECYSTECTOMY  1999  . COLON SURGERY    . COLONOSCOPY  07/05/2012   Procedure: COLONOSCOPY;  Surgeon: Danie Binder, MD;  Location: [facility];  Service: Endoscopy;  Laterality: N/A;  9:30  . COLONOSCOPY N/A 05/07/2017   Procedure: COLONOSCOPY;  Surgeon: Danie Binder, MD;  Location: [facility];  Service: Endoscopy;  Laterality: N/A;  1:45pm  . endometrial mass    . ESOPHAGOGASTRODUODENOSCOPY  05/10/11   Benson: mild narrowing GE junction, Savory dilation  . FOOT SURGERY     left  . PARTIAL COLECTOMY N/A 07/02/2017   Procedure: PARTIAL COLECTOMY;  Surgeon: Aviva Signs, MD;  Location: [facility];  Service: General;  Laterality: N/A;  . SALPINGOOPHORECTOMY     right  . SKIN SURGERY     back of left leg  . THYROIDECTOMY Right 07/04/2019   Procedure: HEMITHYROIDECTOMY;  Surgeon: Leta Baptist, MD;  Location: [place];  Service: ENT;  Laterality: Right;  . TONSILLECTOMY       OB History    Gravida  2   Para  2   Term      Preterm      AB      Living  2     SAB      TAB      Ectopic      Multiple      Live Births  Family History  Problem Relation Age of Onset  . COPD Mother   . Hypertension Father   . Other Brother        paralyzed from waist down; fell out of deer stand  . Other Daughter        born with 2 holes in heart  . Heart disease Maternal Grandfather   . COPD Maternal Grandfather   . Stroke Paternal Grandmother   . Stroke Paternal Grandfather   . Colon cancer Neg Hx     Social History   Tobacco Use  . Smoking status: Never Smoker  . Smokeless tobacco: Never Used  Substance Use Topics  . Alcohol use: Yes    Comment: twice a week  . Drug use: No    Home Medications Prior to Admission medications   Medication Sig Start Date End Date Taking? Authorizing Provider  ADVAIR DISKUS 500-50 MCG/DOSE AEPB 1 puff 2 (two) times daily. 03/25/19   [provider]  albuterol (VENTOLIN HFA) 108 (90  Base) MCG/ACT inhaler Inhale into the lungs every 6 (six) hours as needed for wheezing or shortness of breath.    [provider]  EPIPEN 2-PAK 0.3 MG/0.3ML SOAJ injection Inject 0.3 mg into the muscle as needed (bee stings).  02/23/16   [provider]  estradiol (VIVELLE-DOT) 0.0375 MG/24HR Place 1 patch onto the skin 2 (two) times a week. 04/21/19   Estill Dooms, NP  lidocaine (LIDODERM) 5 % Place 1 patch onto the skin daily. Remove & Discard patch within 12 hours or as directed by MD 10/13/19   Josphine Laffey C, PA-C  methocarbamol (ROBAXIN) 500 MG tablet Take 1 tablet (500 mg total) by mouth 2 (two) times daily. 10/13/19   Arla Boutwell C, PA-C  omeprazole (PRILOSEC) 20 MG capsule Take 1 capsule (20 mg total) by mouth daily as needed. Indigestion. Patient taking differently: Take 20 mg by mouth daily as needed (indigestion). Indigestion. 02/13/14   Estill Dooms, NP  ondansetron (ZOFRAN) 4 MG tablet Take 1 tablet (4 mg total) by mouth every 8 (eight) hours as needed for nausea or vomiting. 07/04/19   Leta Baptist, MD  sertraline (ZOLOFT) 50 MG tablet Take 50 mg by mouth daily. 04/07/19   [provider]    Allergies    Bee venom and Codeine  Review of Systems   Review of Systems  Respiratory: Negative for shortness of breath.   Cardiovascular: Negative for chest pain.  Gastrointestinal: Negative for abdominal pain, nausea and vomiting.  Genitourinary: Negative for difficulty urinating.  Musculoskeletal: Positive for arthralgias, back pain and neck pain.  Neurological: Negative for dizziness, syncope, weakness, numbness and headaches.  Psychiatric/Behavioral: Negative for confusion.  All other systems reviewed and are negative.   Physical Exam Updated Vital Signs BP (!) 144/80   Pulse 73   Temp 98.2 F (36.8 C)   Resp 20   Ht 5' 2.5" (1.588 m)   Wt 86.2 kg   SpO2 99%   BMI 34.20 kg/m   Physical Exam Vitals and nursing note reviewed.  Constitutional:       General: She is not in acute distress.    Appearance: She is well-developed. She is not diaphoretic.  HENT:     Head: Normocephalic and atraumatic.     Mouth/Throat:     Mouth: Mucous membranes are moist.     Pharynx: Oropharynx is clear.  Eyes:     Conjunctiva/sclera: Conjunctivae normal.  Neck:   Cardiovascular:  Rate and Rhythm: Normal rate and regular rhythm.     Pulses: Normal pulses.          Radial pulses are 2+ on the right side and 2+ on the left side.       Dorsalis pedis pulses are 2+ on the right side.       Posterior tibial pulses are 2+ on the right side and 2+ on the left side.     Comments: Tactile temperature in the extremities appropriate and equal bilaterally. Pulmonary:     Effort: Pulmonary effort is normal. No respiratory distress.     Breath sounds: Normal breath sounds.  Abdominal:     Palpations: Abdomen is soft.     Tenderness: There is no abdominal tenderness. There is no guarding.  Musculoskeletal:     Cervical back: Normal range of motion and neck supple. Spinous process tenderness present.     Thoracic back: Tenderness present.     Lumbar back: Tenderness and bony tenderness present.       Back:     Right lower leg: No edema.     Left lower leg: No edema.     Comments: Tenderness to the right lateral hip without swelling, deformity, or instability.  No bruising or abnormal coloring. Full range of motion without noted difficulty.  Minor pain with range of motion.  Skin:    General: Skin is warm and dry.  Neurological:     Mental Status: She is alert.     Comments: No noted acute cognitive deficit. Sensation grossly intact to light touch in the extremities.   Grip strengths equal bilaterally.   Strength 5/5 in all extremities.  No gait disturbance.  Coordination intact.  Cranial nerves III-XII grossly intact.  Handles oral secretions without noted difficulty.  No noted phonation or speech deficit. No facial droop.   Psychiatric:          Mood and Affect: Mood and affect normal.        Speech: Speech normal.        Behavior: Behavior normal.     ED Results / Procedures / Treatments   Labs (all labs ordered are listed, but only abnormal results are displayed) Labs Reviewed - No data to display  EKG None  Radiology DG Thoracic Spine 2 View  Result Date: 10/13/2019 CLINICAL DATA:  Status post fall. EXAM: THORACIC SPINE 2 VIEWS COMPARISON:  None. FINDINGS: There is no evidence of thoracic spine fracture. Alignment is normal. No other significant bone abnormalities are identified. Radiopaque surgical clips are seen overlying the right upper quadrant. IMPRESSION: Negative. Electronically Signed   By: Virgina Norfolk M.D.   On: 10/13/2019 20:40   DG Lumbar Spine Complete  Result Date: 10/13/2019 CLINICAL DATA:  Fall with tenderness. EXAM: LUMBAR SPINE - COMPLETE 4+ VIEW COMPARISON:  CT examination 02/17/2015 FINDINGS: Spinal degenerative changes with multilevel mild degenerative changes. Mild retrolisthesis, 2-3 mm of L1 on L2 and L2 on L3. Spinal alignment otherwise normal without signs of fracture. Degenerative changes with facet arthropathy greatest at L4-5 and L5-S1. Postoperative changes are noted in the RIGHT hemipelvis with surgical clips. Also changes of cholecystectomy are incidentally noted. IMPRESSION: Signs of degenerative change without signs of acute fracture. Electronically Signed   By: Zetta Bills M.D.   On: 10/13/2019 20:34   CT Cervical Spine Wo Contrast  Result Date: 10/13/2019 CLINICAL DATA:  Status post fall. EXAM: CT CERVICAL SPINE WITHOUT CONTRAST TECHNIQUE: Multidetector CT imaging of the cervical spine  was performed without intravenous contrast. Multiplanar CT image reconstructions were also generated. COMPARISON:  None. FINDINGS: Alignment: Normal. Skull base and vertebrae: No acute fracture. No primary bone lesion or focal pathologic process. Soft tissues and spinal canal: No prevertebral fluid  or swelling. No visible canal hematoma. Disc levels: Moderate severity endplate sclerosis is seen at the level of C5-C6 with mild to moderate severity endplate sclerosis noted at the level of C6-C7. Mild anterior osteophyte formation is seen at the level of C4-C5. Marked severity intervertebral disc space narrowing is seen at the level of C5-C6 with moderate severity intervertebral disc space narrowing noted at the level of C6-C7. There is mild bilateral multilevel facet joint hypertrophy. Upper chest: Negative. Other: None. IMPRESSION: 1. No acute fracture within the cervical spine. 2. Marked severity degenerative changes at the level of C5-C6 and C6-C7. Electronically Signed   By: Virgina Norfolk M.D.   On: 10/13/2019 20:45   DG Hip Unilat W or Wo Pelvis 2-3 Views Right  Result Date: 10/13/2019 CLINICAL DATA:  Status post fall. EXAM: DG HIP (WITH OR WITHOUT PELVIS) 2-3V RIGHT COMPARISON:  None. FINDINGS: There is no evidence of hip fracture or dislocation. There is no evidence of arthropathy or other focal bone abnormality. Radiopaque surgical clips are seen overlying the lower right sacroiliac joint. IMPRESSION: No acute osseous abnormality. Electronically Signed   By: Virgina Norfolk M.D.   On: 10/13/2019 20:40    Procedures Procedures (including critical care time)  Medications Ordered in ED Medications  ibuprofen (ADVIL) tablet 600 mg (600 mg Oral Given 10/13/19 1931)    ED Course  I have reviewed the triage vital signs and the nursing notes.  Pertinent labs & imaging results that were available during my care of the patient were reviewed by me and considered in my medical decision making (see chart for details).    MDM Rules/Calculators/A&P                      Patient presents with pain to the neck, back, and right hip following a fall last night.  No focal neurologic deficits on exam.    The patient was given instructions for home care as well as return precautions. Patient  voices understanding of these instructions, accepts the plan, and is comfortable with discharge.  I reviewed and interpreted the patient's radiological studies. No acute abnormalities noted on imaging studies.   Final Clinical Impression(s) / ED Diagnoses Final diagnoses:  Fall, initial encounter  Right hip pain  Neck pain  Acute midline low back pain without sciatica    Rx / DC Orders ED Discharge Orders         Ordered    methocarbamol (ROBAXIN) 500 MG tablet  2 times daily     10/13/19 2107    lidocaine (LIDODERM) 5 %  Every 24 hours     10/13/19 2108           Layla Maw 10/13/19 2108    Hayden Rasmussen, MD 10/14/19 1101

## 2019-10-13 NOTE — Discharge Instructions (Addendum)

## 2019-12-04 IMAGING — US US THYROID
1 series · 13 of 25 positions shown · non-contrast
Comparison: CT scan of the chest 02/19/2019

CLINICAL DATA: Incidental on CT. 57-year-old female with
right-sided thyroid nodule seen on a prior CT scan

EXAM:
THYROID ULTRASOUND
TECHNIQUE: Ultrasound examination of the thyroid gland and adjacent soft
tissues was performed.

[Series 1: us thyroid · 0.06mm/px · 13 of 117 slices shown]
[im 1/117]
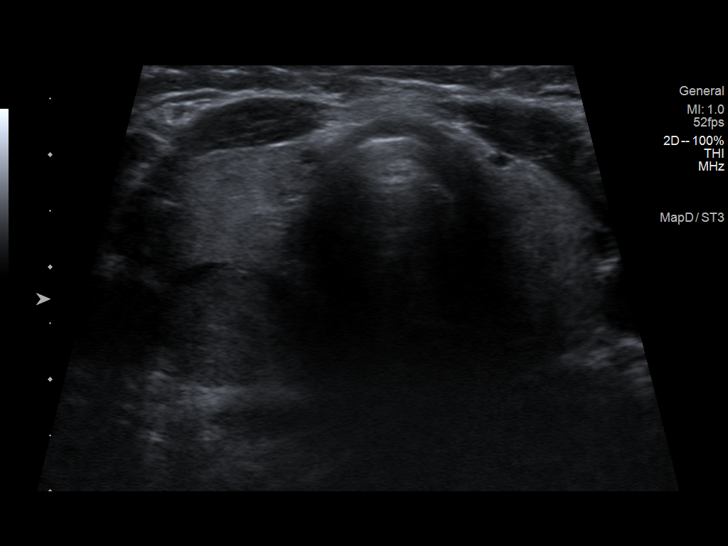
[im 10/117]
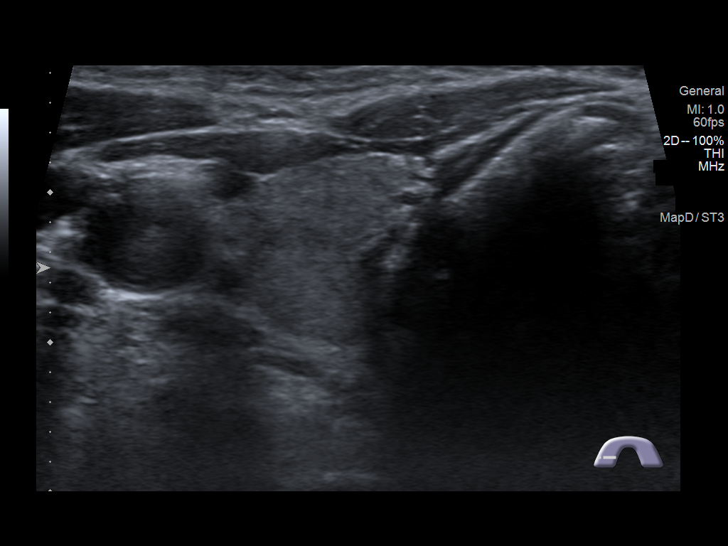
[im 20/117]
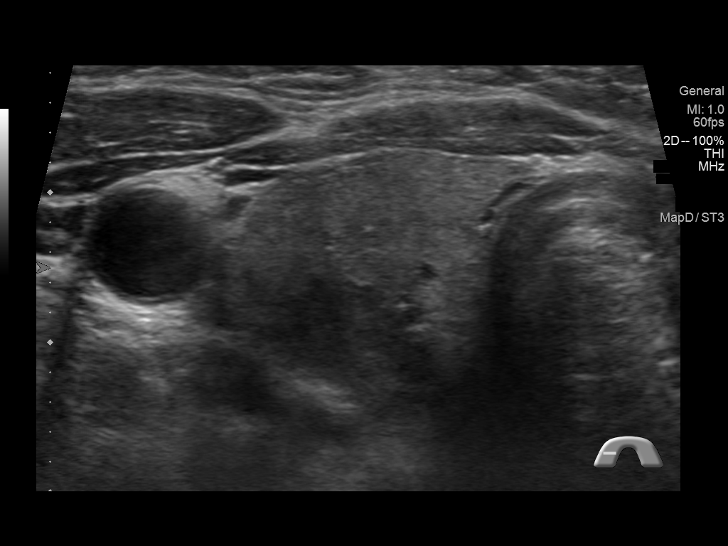
[im 30/117]
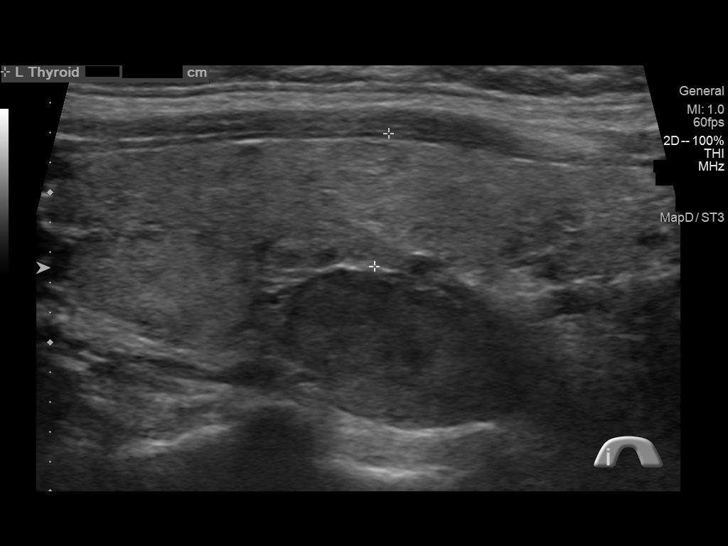
[im 39/117]
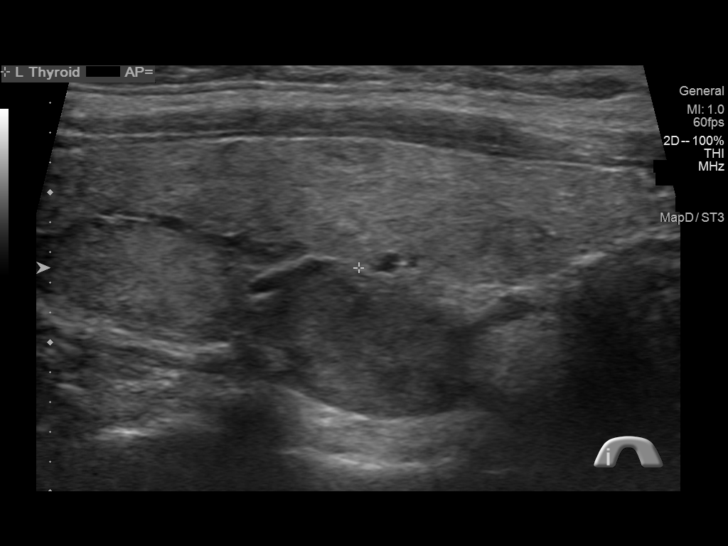
[im 49/117]
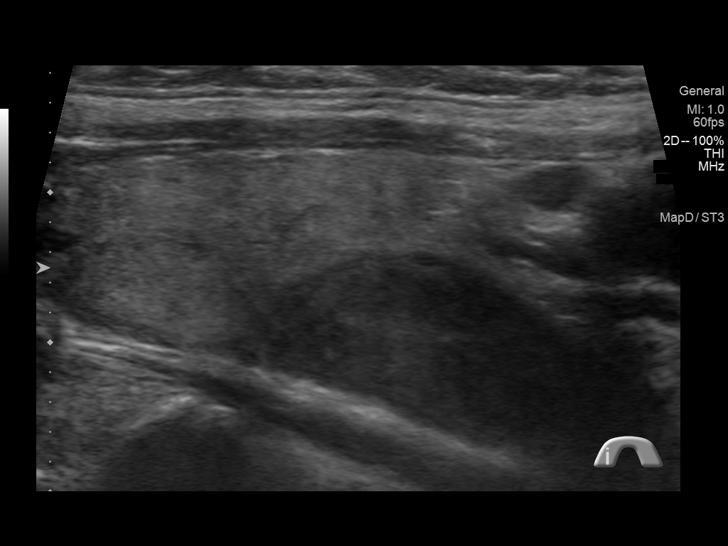
[im 59/117]
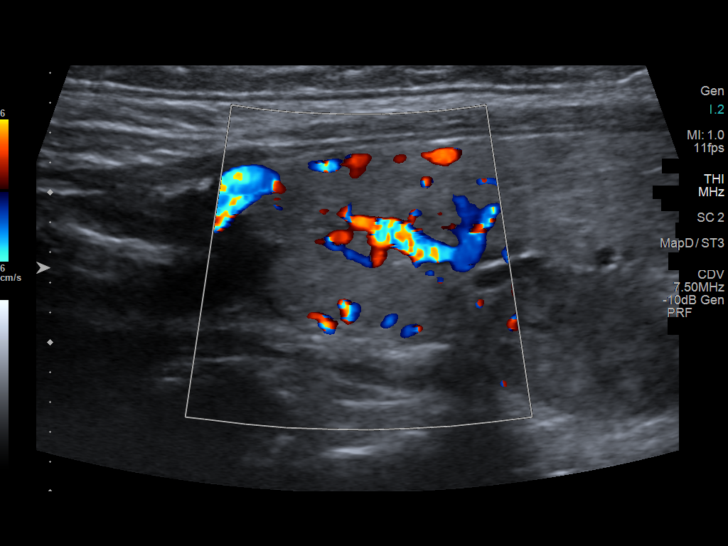
[im 68/117]
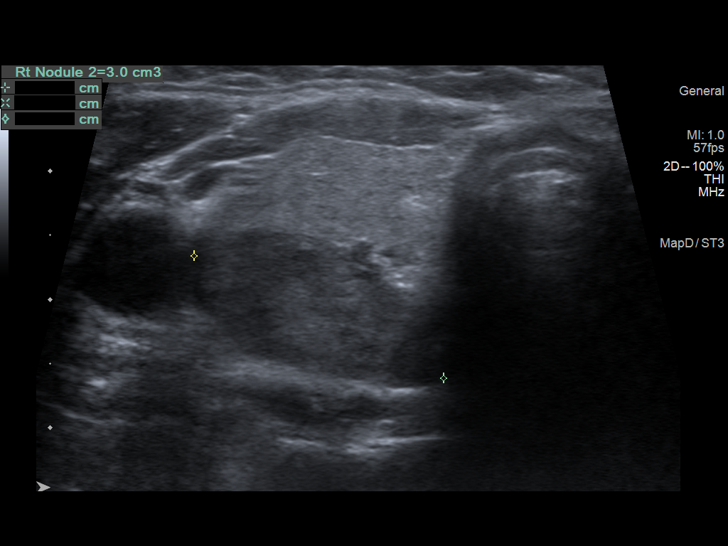
[im 78/117]
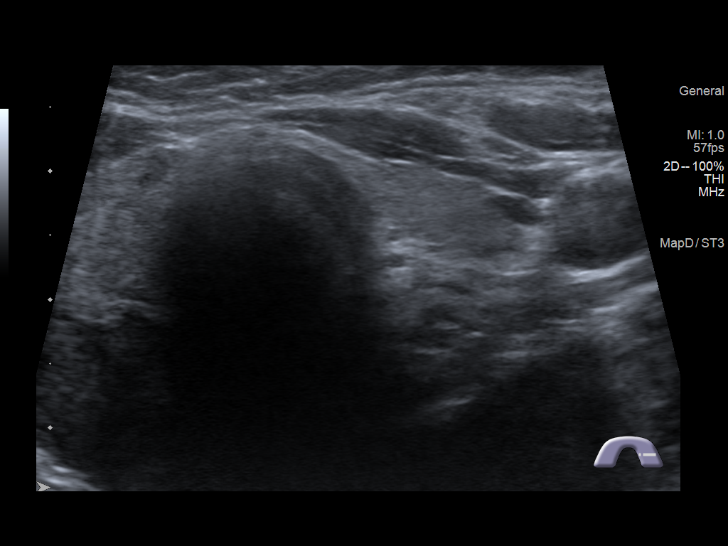
[im 88/117]
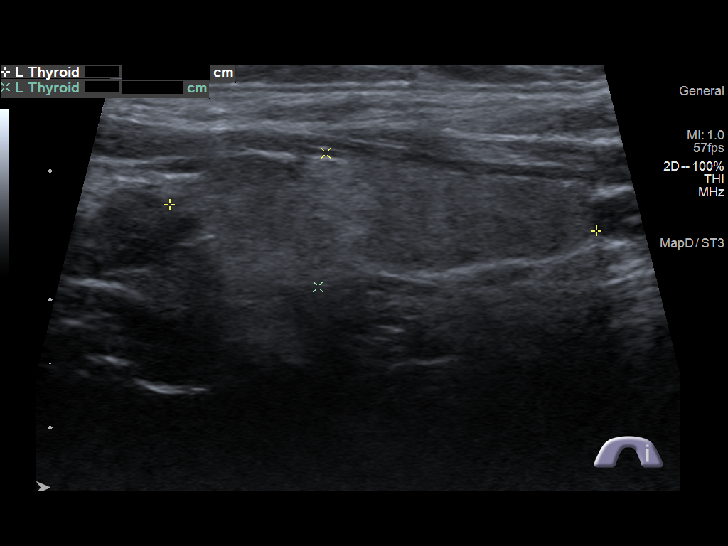
[im 97/117]
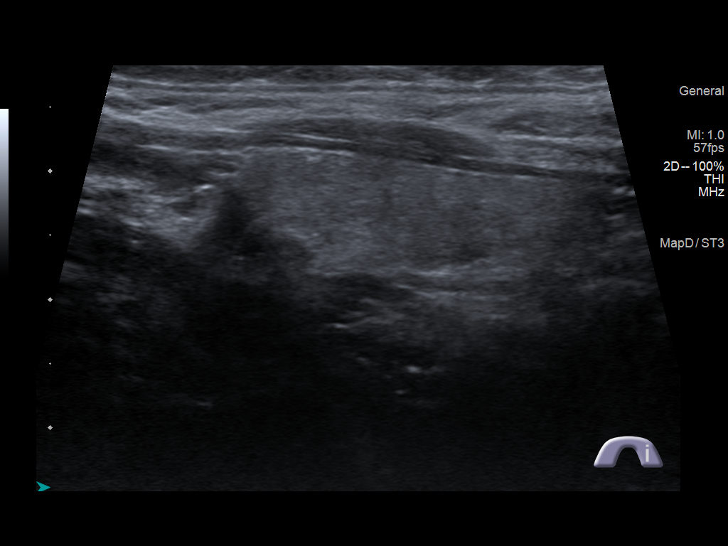
[im 107/117]
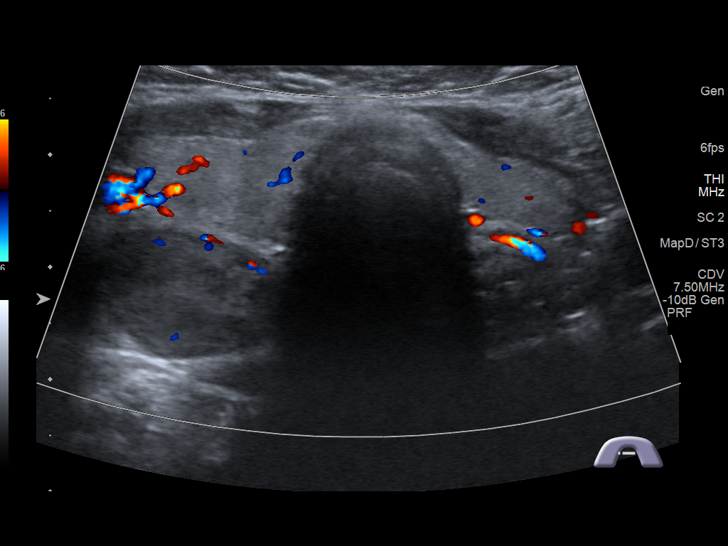
[im 117/117]
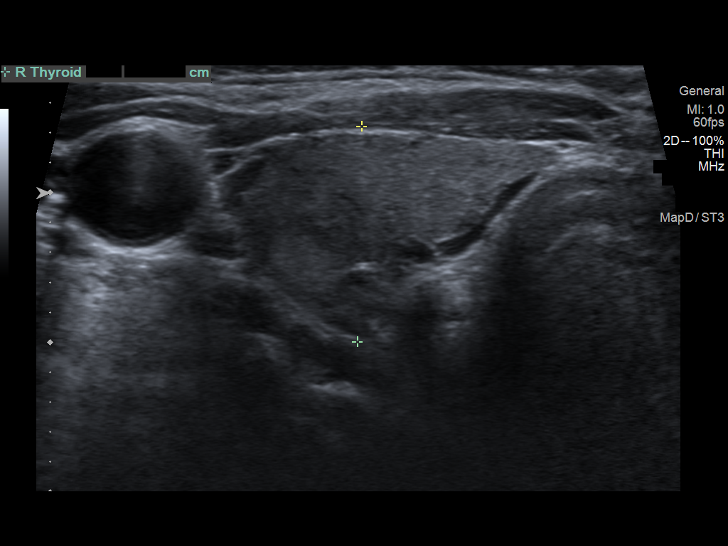

[13 of 25 positions shown; findings below may reference images not displayed]

FINDINGS: Parenchymal Echotexture: Moderately heterogenous

Isthmus: 0.3 cm

Right lobe: 4.2 x 1.9 x 1.3 cm

Left lobe: 3.3 x 0.9 x 1.4 cm

_________________________________________________________

Estimated total number of nodules >/= 1 cm: 2

Number of spongiform nodules >/=  2 cm not described below (TR1): 0

Number of mixed cystic and solid nodules >/= 1.5 cm not described
below (TR2): 0

_________________________________________________________

Nodule # 1:

Location: Right; Superior

Maximum size: 1.3 cm; Other 2 dimensions: 0.7 x 1.3 cm

Composition: solid/almost completely solid (2)

Echogenicity: isoechoic (1)

Shape: not taller-than-wide (0)

Margins: smooth (0)

Echogenic foci: none (0)

ACR TI-RADS total points: 3.

ACR TI-RADS risk category: 3.

ACR TI-RADS recommendations:

Given size (<1.4 cm) and appearance, this nodule does NOT meet
TI-RADS criteria for biopsy or dedicated follow-up.

_________________________________________________________

Nodule # 2:

Location: Right; Mid

Maximum size: 2.2 cm; Other 2 dimensions: 1.7 x 2.1 cm

Composition: solid/almost completely solid (2)

Echogenicity: hypoechoic (2)

Shape: not taller-than-wide (0)

Margins: smooth (0)

Echogenic foci: none (0)

ACR TI-RADS total points: 4.

ACR TI-RADS risk category: TR4 (4-6 points).

ACR TI-RADS recommendations:

**Given size (>/= 1.5 cm) and appearance, fine needle aspiration of
this moderately suspicious nodule should be considered based on
TI-RADS criteria.

_________________________________________________________
IMPRESSION: 1. A 2.2 cm TI-RADS category 4 nodule (labeled # 2 above) in the
deep aspect of the right mid gland meets criteria to consider
fine-needle aspiration biopsy.
2. Incidental note is made of a small 1.3 cm TI-RADS category 3
nodule in the right superior gland which does not require further
evaluation.

The above is in keeping with the ACR TI-RADS recommendations - [HOSPITAL] 1342;[DATE].

## 2019-12-10 ENCOUNTER — Encounter: Payer: Self-pay | Admitting: Gastroenterology

## 2019-12-10 NOTE — Progress Notes (Signed)
Referring Provider: Rory Percy, MD Primary Care Physician:  Rory Percy, MD Primary GI Physician: Dr. Oneida Alar; Dr. Gala Romney following for now; will establish with Dr. Abbey Chatters  Chief Complaint  Patient presents with  . Dysphagia    few episodes of choking past few weeks    HPI:   Denise Taylor is a 59 y.o. female with history of GERD, dysphagia, recurrent sigmoid diverticulitis s/p sigmoid colectomy January 2019, last colonoscopy December 2018 with diverticulosis in the rectosigmoid colon, sigmoid colon, descending colon, and transverse colon, significant looping of the colon, external and internal hemorrhoids.  Recommended repeat exam in 5-10 years.  She is presenting today for dysphagia. Last EGD in 2012 by Dr. Britta Mccreedy at [facility] with a narrowed GE junction s/p dilation.  Patient last seen in our office in August 2019 for dysphagia, diverticulitis of the sigmoid colon, and GERD.  Reported difficulty swallowing about twice a month.  She preferred to hold off on EGD due to work issues and reapproach when things settle down after 2 months.  GERD was well controlled on Prilosec as needed.  No other significant GI concerns.  She continued to do well postop from sigmoid colectomy.  Plan to follow-up in 3 months.  Patient canceled follow-up appointment in December 2019.  Per chart review, patient recently underwent right total thyroid lobectomy in January 2021 for thyroid nodule and possible parathyroid adenoma.  Surgical findings included 2 cm right posterior thyroid mass.  Pathology revealed nodular hyperplasia, benign.  Today: Continues with dysphagia with foods hanging in her mid esophagus. Symptoms started to return over the last few years. Over the last few weeks symptoms have been more frequent and severe. Symptoms occur with bread and meats. If something gets stuck, she has to bring it back up. Can't drink something to push it down. Occurring about twice a week.    Admits to reflux symptoms about once a week. Taking Prilosec as needed. Hasn't taken it on a daily bases for quite some time.   No nausea or vomiting. History of migraines. Doesn't occur often but has nausea with this. Has Zofran which helps. No abdominal pain. No unintentional weight loss.    BMs regular. No constipation or diarrhea. No brbpr or melena.   Aleve about once a month for plantar factitias.   History of pneumonia followed by reactive lung disease November/December 2019. Symptoms have essentially resolved. Was placed on inhalers for 8 months. No longer on inhalers. No SOB at rest. No real SOB with exertion. No regular cough. No chest pain or palpitations.   Past Medical History:  Diagnosis Date  . Complication of anesthesia    PONV  . Current use of estrogen therapy 02/13/2014  . Diverticulitis    4 episodes in the past 1-2 years.  . Diverticulitis   . Endometriosis   . GERD (gastroesophageal reflux disease)   . Indigestion 02/13/2014  . Parathyroid adenoma   . PONV (postoperative nausea and vomiting)   . Reactive airway disease    Following pneumonia x2 in 2019. Resolved.     Past Surgical History:  Procedure Laterality Date  . ABDOMINAL HYSTERECTOMY     partial  . BREAST REDUCTION SURGERY    . CHOLECYSTECTOMY  1999  . COLON SURGERY    . COLONOSCOPY  07/05/2012   Procedure: COLONOSCOPY;  Surgeon: Danie Binder, MD;  [facility];  Service: Endoscopy;  Laterality: N/A;  9:30  . COLONOSCOPY N/A 05/07/2017   Procedure: COLONOSCOPY;  Surgeon: Danie Binder, MD;  diverticulosis in the rectosigmoid colon, sigmoid colon, descending colon, and transverse colon, significant looping of the colon, external and internal hemorrhoids.  Recommended repeat exam in 5-10 years.   . endometrial mass    . ESOPHAGOGASTRODUODENOSCOPY  05/10/11   Benson: mild narrowing GE junction, Savory dilation  . FOOT SURGERY     left  . PARTIAL COLECTOMY N/A 07/02/2017   Procedure:  PARTIAL COLECTOMY;  Surgeon: Aviva Signs, MD;  Location: [facility];  Service: General;  Laterality: N/A;  . SALPINGOOPHORECTOMY     right  . SKIN SURGERY     back of left leg  . THYROIDECTOMY Right 07/04/2019   Procedure: HEMITHYROIDECTOMY;  Surgeon: Leta Baptist, MD;  Location: [facility];  Service: ENT;  Laterality: Right;  . TONSILLECTOMY      Current Outpatient Medications  Medication Sig Dispense Refill  . EPIPEN 2-PAK 0.3 MG/0.3ML SOAJ injection Inject 0.3 mg into the muscle as needed (bee stings).     Marland Kitchen estradiol (VIVELLE-DOT) 0.0375 MG/24HR Place 1 patch onto the skin 2 (two) times a week. 24 patch 4  . omeprazole (PRILOSEC) 20 MG capsule Take 1 capsule (20 mg total) by mouth daily before breakfast. Indigestion. 30 capsule 5  . ondansetron (ZOFRAN) 4 MG tablet Take 1 tablet (4 mg total) by mouth every 8 (eight) hours as needed for nausea or vomiting. 15 tablet 2   No current facility-administered medications for this visit.    Allergies as of 12/11/2019 - Review Complete 10/13/2019  Allergen Reaction Noted  . Bee venom Anaphylaxis 05/03/2017  . Codeine Nausea Only 02/17/2015    Family History  Problem Relation Age of Onset  . COPD Mother   . Hypertension Father   . Other Brother        paralyzed from waist down; fell out of deer stand  . Other Daughter        born with 2 holes in heart  . Heart disease Maternal Grandfather   . COPD Maternal Grandfather   . Stroke Paternal Grandmother   . Stroke Paternal Grandfather   . Colon cancer Cousin 21    Social History   Socioeconomic History  . Marital status: Married    Spouse name: Not on file  . Number of children: 2  . Years of education: Not on file  . Highest education level: Not on file  Occupational History  . Occupation: Software engineer, [employer], [employer]  Tobacco Use  . Smoking status: Never Smoker  . Smokeless tobacco: Never Used  Vaping Use  . Vaping Use: Never used  Substance and Sexual Activity   . Alcohol use: Yes    Comment: twice a week  . Drug use: No  . Sexual activity: Yes    Partners: Male    Birth control/protection: Surgical    Comment: hyst  Other Topics Concern  . Not on file  Social History Narrative   Lives w/ husband   Social Determinants of Health   Financial Resource Strain:   . Difficulty of Paying Living Expenses:   Food Insecurity:   . Worried About Charity fundraiser in the Last Year:   . Arboriculturist in the Last Year:   Transportation Needs:   . Film/video editor (Medical):   Marland Kitchen Lack of Transportation (Non-Medical):   Physical Activity:   . Days of Exercise per Week:   . Minutes of Exercise per Session:   Stress:   .  Feeling of Stress :   Social Connections:   . Frequency of Communication with Friends and Family:   . Frequency of Social Gatherings with Friends and Family:   . Attends Religious Services:   . Active Member of Clubs or Organizations:   . Attends Archivist Meetings:   Marland Kitchen Marital Status:     Review of Systems: Gen: Denies fever, chills, cold or flu like symptoms, lightheadedness, dizziness, pre-syncope, or syncope.  CV: See HPI Resp: See HPI GI: See HPI Psych: Denies depression or anxiety Heme: See HPI  Physical Exam: BP (!) 141/87   Pulse 67   Temp 97.6 F (36.4 C)   Ht 5' 2.5" (1.588 m)   Wt 192 lb 6.4 oz (87.3 kg)   BMI 34.63 kg/m  General:   Alert and oriented. No distress noted. Pleasant and cooperative.  Head:  Normocephalic and atraumatic. Eyes:  Conjuctiva clear without scleral icterus. Heart:  S1, S2 present without murmurs appreciated. Lungs:  Clear to auscultation bilaterally. No wheezes, rales, or rhonchi. No distress.  Abdomen:  +BS, soft, non-tender and non-distended. No rebound or guarding. No HSM or masses noted. Msk:  Symmetrical without gross deformities. Normal posture. Extremities:  Without edema. Neurologic:  Alert and  oriented x4 Psych: Normal mood and affect.

## 2019-12-11 ENCOUNTER — Ambulatory Visit (INDEPENDENT_AMBULATORY_CARE_PROVIDER_SITE_OTHER): Payer: No Typology Code available for payment source | Admitting: Gastroenterology

## 2019-12-11 ENCOUNTER — Other Ambulatory Visit: Payer: Self-pay

## 2019-12-11 ENCOUNTER — Encounter: Payer: Self-pay | Admitting: Gastroenterology

## 2019-12-11 VITALS — BP 141/87 | HR 67 | Temp 97.6°F | Ht 62.5 in | Wt 192.4 lb

## 2019-12-11 DIAGNOSIS — R1310 Dysphagia, unspecified: Secondary | ICD-10-CM

## 2019-12-11 DIAGNOSIS — K219 Gastro-esophageal reflux disease without esophagitis: Secondary | ICD-10-CM | POA: Diagnosis not present

## 2019-12-11 MED ORDER — OMEPRAZOLE 20 MG PO CPDR: 20.0000 mg | DELAYED_RELEASE_CAPSULE | Freq: Every day | ORAL | 5 refills | Status: DC

## 2019-12-11 NOTE — Patient Instructions (Signed)
We will get you scheduled for an upper endoscopy with possible dilation of your esophagus in the near future with Dr. Gala Romney or Dr. Abbey Chatters.   Should something get hung in your esophagus and not come up or go down, you should proceed to the emergency room.   Recommend following a soft diet for now. If you consume any meats, they should be chopped finely or ground.   For now, I recommend you start taking omeprazole 20 mg daily 30 minutes before breakfast. I have sent a prescription to your pharmacy.   Follow a GERD diet:  Avoid fried, fatty, greasy, spicy, citrus foods. Avoid caffeine and carbonated beverages. Avoid chocolate. Try eating 4-6 small meals a day rather than 3 large meals. Do not eat within 3 hours of laying down. Prop head of bed up on wood or bricks to create a 6 inch incline.  We will plan to see you back after your procedure.  Do not hesitate to call with questions or concerns prior.  Aliene Altes, PA-C [facility]

## 2019-12-11 NOTE — Assessment & Plan Note (Signed)
Chronic.  Symptoms fairly well controlled on Prilosec as needed but she does note symptoms occur about once a week.  Also with several year history of dysphagia with worsening symptoms over the last few weeks requiring food regurgitation.   Query whether dysphagia is related to uncontrolled GERD reflux esophagitis.  I recommend patient to go ahead and resume Prilosec 20 mg every day 30 minutes before breakfast and counseled on GERD diet.  We will also proceed with EGD +/- dilation in the near future with Dr. Abbey Chatters or Dr. Gala Romney for dysphagia as per above. Plan to follow-up after EGD.

## 2019-12-11 NOTE — Assessment & Plan Note (Addendum)
Several year history of intermittent dysphagia symptoms that have been worsening over the last few weeks.  Sensation of solid foods getting hung in her midesophagus requiring food regurgitation a couple times a week.  Last EGD in 2012 with Dr. Britta Mccreedy at [facility] with an area GE junction s/p dilation with improvement in dysphagia symptoms.  Also with history of chronic GERD that has been fairly well managed on PPI as needed with symptoms about once a week.   Query whether dysphagia may be related to uncontrolled GERD/reflux esophagitis, esophageal web, ring, or stricture.  Less likely malignancy.  Proceed with EGD +/- dilation with propofol with Dr. Abbey Chatters or Dr. Gala Romney in the near future. The risks, benefits, and alternatives have been discussed in detail with patient. They have stated understanding and desire to proceed.  Propofol as procedure with likely be with Dr. Abbey Chatters who only uses propofol.  ASA II Recommended soft diet for now.  All meats should be chopped or ground finely. Advised to proceed to the emergency room if something were to get hung in her esophagus and not come up or go down. Advised to resume taking Prilosec 20 mg daily 30 minutes before breakfast. Counseled on GERD diet. Plan to follow-up after EGD.

## 2019-12-12 ENCOUNTER — Telehealth: Payer: Self-pay | Admitting: *Deleted

## 2019-12-12 NOTE — Telephone Encounter (Signed)
Called pt. EGD +/- dil with propofol with Dr. Abbey Chatters scheduled for 8/9 at 8:30am. Patient aware will need covid test appt. Will mail with instructions. Address confirmed.

## 2020-01-01 ENCOUNTER — Encounter (HOSPITAL_COMMUNITY): Payer: Self-pay

## 2020-01-01 ENCOUNTER — Encounter (HOSPITAL_COMMUNITY)
Admission: RE | Admit: 2020-01-01 | Discharge: 2020-01-01 | Disposition: A | Discharge status: Home / Self Care | Payer: No Typology Code available for payment source | Source: Ambulatory Visit | Attending: Internal Medicine | Admitting: Internal Medicine

## 2020-01-01 ENCOUNTER — Other Ambulatory Visit: Payer: Self-pay

## 2020-01-09 ENCOUNTER — Other Ambulatory Visit (HOSPITAL_COMMUNITY)
Admission: RE | Admit: 2020-01-09 | Discharge: 2020-01-09 | Disposition: A | Discharge status: Home / Self Care | Payer: No Typology Code available for payment source | Source: Ambulatory Visit | Attending: Internal Medicine | Admitting: Internal Medicine

## 2020-01-09 ENCOUNTER — Other Ambulatory Visit: Payer: Self-pay

## 2020-01-09 DIAGNOSIS — Z20822 Contact with and (suspected) exposure to covid-19: Secondary | ICD-10-CM | POA: Insufficient documentation

## 2020-01-09 DIAGNOSIS — Z01812 Encounter for preprocedural laboratory examination: Secondary | ICD-10-CM | POA: Insufficient documentation

## 2020-01-09 LAB — SARS CORONAVIRUS 2 (TAT 6-24 HRS): SARS Coronavirus 2: NEGATIVE

## 2020-01-12 ENCOUNTER — Encounter (HOSPITAL_COMMUNITY)
Admission: RE | Disposition: A | Discharge status: Home / Self Care | Payer: Self-pay | Source: Home / Self Care | Attending: Internal Medicine

## 2020-01-12 ENCOUNTER — Other Ambulatory Visit: Payer: Self-pay

## 2020-01-12 ENCOUNTER — Ambulatory Visit (HOSPITAL_COMMUNITY): Payer: No Typology Code available for payment source | Admitting: Anesthesiology

## 2020-01-12 ENCOUNTER — Encounter (HOSPITAL_COMMUNITY): Payer: Self-pay | Admitting: *Deleted

## 2020-01-12 ENCOUNTER — Ambulatory Visit (HOSPITAL_COMMUNITY)
Admission: RE | Admit: 2020-01-12 | Discharge: 2020-01-12 | Disposition: A | Discharge status: Home / Self Care | Payer: No Typology Code available for payment source | Attending: Internal Medicine | Admitting: Internal Medicine

## 2020-01-12 DIAGNOSIS — K2970 Gastritis, unspecified, without bleeding: Secondary | ICD-10-CM | POA: Diagnosis not present

## 2020-01-12 DIAGNOSIS — Z800 Family history of malignant neoplasm of digestive organs: Secondary | ICD-10-CM | POA: Insufficient documentation

## 2020-01-12 DIAGNOSIS — K317 Polyp of stomach and duodenum: Secondary | ICD-10-CM | POA: Diagnosis not present

## 2020-01-12 DIAGNOSIS — Z90721 Acquired absence of ovaries, unilateral: Secondary | ICD-10-CM | POA: Insufficient documentation

## 2020-01-12 DIAGNOSIS — K222 Esophageal obstruction: Secondary | ICD-10-CM | POA: Diagnosis not present

## 2020-01-12 DIAGNOSIS — Z90710 Acquired absence of both cervix and uterus: Secondary | ICD-10-CM | POA: Diagnosis not present

## 2020-01-12 DIAGNOSIS — E890 Postprocedural hypothyroidism: Secondary | ICD-10-CM | POA: Insufficient documentation

## 2020-01-12 DIAGNOSIS — Z885 Allergy status to narcotic agent status: Secondary | ICD-10-CM | POA: Insufficient documentation

## 2020-01-12 DIAGNOSIS — J45909 Unspecified asthma, uncomplicated: Secondary | ICD-10-CM | POA: Insufficient documentation

## 2020-01-12 DIAGNOSIS — K228 Other specified diseases of esophagus: Secondary | ICD-10-CM | POA: Insufficient documentation

## 2020-01-12 DIAGNOSIS — Z825 Family history of asthma and other chronic lower respiratory diseases: Secondary | ICD-10-CM | POA: Insufficient documentation

## 2020-01-12 DIAGNOSIS — R1310 Dysphagia, unspecified: Secondary | ICD-10-CM

## 2020-01-12 DIAGNOSIS — Z8719 Personal history of other diseases of the digestive system: Secondary | ICD-10-CM | POA: Diagnosis not present

## 2020-01-12 DIAGNOSIS — Z823 Family history of stroke: Secondary | ICD-10-CM | POA: Insufficient documentation

## 2020-01-12 DIAGNOSIS — Z9049 Acquired absence of other specified parts of digestive tract: Secondary | ICD-10-CM | POA: Insufficient documentation

## 2020-01-12 DIAGNOSIS — K219 Gastro-esophageal reflux disease without esophagitis: Secondary | ICD-10-CM | POA: Diagnosis not present

## 2020-01-12 DIAGNOSIS — Z79899 Other long term (current) drug therapy: Secondary | ICD-10-CM | POA: Insufficient documentation

## 2020-01-12 DIAGNOSIS — Z8249 Family history of ischemic heart disease and other diseases of the circulatory system: Secondary | ICD-10-CM | POA: Diagnosis not present

## 2020-01-12 DIAGNOSIS — Z91030 Bee allergy status: Secondary | ICD-10-CM | POA: Diagnosis not present

## 2020-01-12 HISTORY — PX: BALLOON DILATION: SHX5330

## 2020-01-12 HISTORY — PX: ESOPHAGOGASTRODUODENOSCOPY (EGD) WITH PROPOFOL: SHX5813

## 2020-01-12 HISTORY — PX: BIOPSY: SHX5522

## 2020-01-12 SURGERY — ESOPHAGOGASTRODUODENOSCOPY (EGD) WITH PROPOFOL
Anesthesia: General

## 2020-01-12 MED ORDER — PROPOFOL 500 MG/50ML IV EMUL
INTRAVENOUS | Status: DC | PRN
  Administered 2020-01-12: 175 ug/kg/min via INTRAVENOUS
  Administered 2020-01-12: 110 mg via INTRAVENOUS

## 2020-01-12 MED ORDER — OMEPRAZOLE 40 MG PO CPDR: 40.0000 mg | DELAYED_RELEASE_CAPSULE | Freq: Two times a day (BID) | ORAL | 5 refills | Status: DC

## 2020-01-12 MED ORDER — LIDOCAINE VISCOUS HCL 2 % MT SOLN: 15.0000 mL | Freq: Once | OROMUCOSAL | Status: DC

## 2020-01-12 MED ORDER — LIDOCAINE VISCOUS HCL 2 % MT SOLN
OROMUCOSAL | Status: AC
  Administered 2020-01-12: 15 mL
  Filled 2020-01-12: qty 15

## 2020-01-12 MED ORDER — GLYCOPYRROLATE PF 0.2 MG/ML IJ SOSY
PREFILLED_SYRINGE | INTRAMUSCULAR | Status: AC
  Administered 2020-01-12: 0.2 mg
  Filled 2020-01-12: qty 1

## 2020-01-12 MED ORDER — LIDOCAINE HCL (CARDIAC) PF 50 MG/5ML IV SOSY
PREFILLED_SYRINGE | INTRAVENOUS | Status: DC | PRN
  Administered 2020-01-12: 100 mg via INTRAVENOUS

## 2020-01-12 MED ORDER — GLYCOPYRROLATE 0.2 MG/ML IV SOSY
0.2000 mg | PREFILLED_SYRINGE | Freq: Once | INTRAMUSCULAR | Status: DC
  Filled 2020-01-12: qty 5

## 2020-01-12 MED ORDER — LACTATED RINGERS IV SOLN: INTRAVENOUS | Status: DC

## 2020-01-12 MED ORDER — STERILE WATER FOR IRRIGATION IR SOLN
Status: DC | PRN
  Administered 2020-01-12: 1.5 mL

## 2020-01-12 MED ORDER — CHLORHEXIDINE GLUCONATE CLOTH 2 % EX PADS: 6.0000 | MEDICATED_PAD | Freq: Once | CUTANEOUS | Status: DC

## 2020-01-12 NOTE — Discharge Instructions (Addendum)
  Standard post EGD instructions.   Increase Omeprazole to 40 mg BID x8 weeks.  Repeat EGD with dilation as needed  Await path results  Return to GI clinic with [facility] in 3-4 weeks.    Upper Endoscopy, Adult, Care After This sheet gives you information about how to care for yourself after your procedure. Your health care provider may also give you more specific instructions. If you have problems or questions, contact your health care provider. What can I expect after the procedure? After the procedure, it is common to have:  A sore throat.  Mild stomach pain or discomfort.  Bloating.  Nausea. Follow these instructions at home:   Follow instructions from your health care provider about what to eat or drink after your procedure.  Return to your normal activities as told by your health care provider. Ask your health care provider what activities are safe for you.  Take over-the-counter and prescription medicines only as told by your health care provider.  Do not drive for 24 hours if you were given a sedative during your procedure.  Keep all follow-up visits as told by your health care provider. This is important. Contact a health care provider if you have:  A sore throat that lasts longer than one day.  Trouble swallowing. Get help right away if:  You vomit blood or your vomit looks like coffee grounds.  You have: ? A fever. ? Bloody, black, or tarry stools. ? A severe sore throat or you cannot swallow. ? Difficulty breathing. ? Severe pain in your chest or abdomen. Summary  After the procedure, it is common to have a sore throat, mild stomach discomfort, bloating, and nausea.  Do not drive for 24 hours if you were given a sedative during the procedure.  Follow instructions from your health care provider about what to eat or drink after your procedure.  Return to your normal activities as told by your health care provider. This information is not intended to  replace advice given to you by your health care provider. Make sure you discuss any questions you have with your health care provider. Document Revised: 11/13/2017 Document Reviewed: 10/22/2017 Elsevier Patient Education  [place].

## 2020-01-12 NOTE — H&P (Signed)
Referring Provider: No ref. provider found Primary Care Physician:  Rory Percy, MD Primary Gastroenterologist:  Dr.  Harrison Mons:  Denise Taylor is a 59 y.o. female who presents for upper endoscopy due to history of dysphagia and reflux  Past Medical History:  Diagnosis Date  . Complication of anesthesia    PONV  . Current use of estrogen therapy 02/13/2014  . Diverticulitis    4 episodes in the past 1-2 years.  . Diverticulitis   . Endometriosis   . GERD (gastroesophageal reflux disease)   . Indigestion 02/13/2014  . Parathyroid adenoma   . PONV (postoperative nausea and vomiting)   . Reactive airway disease    Following pneumonia x2 in 2019. Resolved.     Past Surgical History:  Procedure Laterality Date  . ABDOMINAL HYSTERECTOMY     partial  . BREAST REDUCTION SURGERY    . CHOLECYSTECTOMY  1999  . COLON SURGERY     8-12 inches removed for diverticulitis  . COLONOSCOPY  07/05/2012   Procedure: COLONOSCOPY;  Surgeon: Danie Binder, MD;  [facility];  Service: Endoscopy;  Laterality: N/A;  9:30  . COLONOSCOPY N/A 05/07/2017   Procedure: COLONOSCOPY;  Surgeon: Danie Binder, MD;  diverticulosis in the rectosigmoid colon, sigmoid colon, descending colon, and transverse colon, significant looping of the colon, external and internal hemorrhoids.  Recommended repeat exam in 5-10 years.   . endometrial mass    . ESOPHAGOGASTRODUODENOSCOPY  05/10/11   Benson: mild narrowing GE junction, Savory dilation  . FOOT SURGERY Left    shorten bone  . PARTIAL COLECTOMY N/A 07/02/2017   Procedure: PARTIAL COLECTOMY;  Surgeon: Aviva Signs, MD;  Location: [facility];  Service: General;  Laterality: N/A;  . RECONSTRUCTION OF NOSE    . SALPINGOOPHORECTOMY     right  . SKIN SURGERY     back of left leg  . THYROIDECTOMY Right 07/04/2019   Procedure: HEMITHYROIDECTOMY;  Surgeon: Leta Baptist, MD;  Location: [place];  Service: ENT;  Laterality: Right;  . TONSILLECTOMY       Prior to Admission medications   Medication Sig Start Date End Date Taking? Authorizing Provider  estradiol (VIVELLE-DOT) 0.0375 MG/24HR Place 1 patch onto the skin 2 (two) times a week. 04/21/19  Yes Estill Dooms, NP  omeprazole (PRILOSEC) 20 MG capsule Take 1 capsule (20 mg total) by mouth daily before breakfast. Indigestion. 12/11/19  Yes Harper, Kristen S, PA-C  EPIPEN 2-PAK 0.3 MG/0.3ML SOAJ injection Inject 0.3 mg into the muscle as needed (bee stings).  02/23/16   [provider]  ondansetron (ZOFRAN) 4 MG tablet Take 1 tablet (4 mg total) by mouth every 8 (eight) hours as needed for nausea or vomiting. 07/04/19   Leta Baptist, MD    Current Facility-Administered Medications  Medication Dose Route Frequency Provider Last Rate Last Admin  . Chlorhexidine Gluconate Cloth 2 % PADS 6 each  6 each Topical Once Eloise Harman, DO       And  . Chlorhexidine Gluconate Cloth 2 % PADS 6 each  6 each Topical Once Hurshel Keys K, DO      . glycopyrrolate (0.2mg /mL) syringe  0.2 mg Intravenous Once Louann Sjogren, MD      . lactated ringers infusion   Intravenous Continuous Louann Sjogren, MD 10 mL/hr at 01/12/20 0803 New Bag at 01/12/20 0803  . lidocaine (XYLOCAINE) 2 % viscous mouth solution 15 mL  15 mL Mouth/Throat Once Kiel, Brendan J,  MD      . simethicone susp in sterile water 1000 mL irrigation    PRN Hurshel Keys K, DO   1.5 mL at 01/12/20 0835    Allergies as of 12/12/2019 - Review Complete 10/13/2019  Allergen Reaction Noted  . Bee venom Anaphylaxis 05/03/2017  . Codeine Nausea Only 02/17/2015    Family History  Problem Relation Age of Onset  . COPD Mother   . Hypertension Father   . Other Brother        paralyzed from waist down; fell out of deer stand  . Other Daughter        born with 2 holes in heart  . Heart disease Maternal Grandfather   . COPD Maternal Grandfather   . Stroke Paternal Grandmother   . Stroke Paternal Grandfather   . Colon cancer  Cousin 45    Social History   Socioeconomic History  . Marital status: Married    Spouse name: Not on file  . Number of children: 2  . Years of education: Not on file  . Highest education level: Not on file  Occupational History  . Occupation: Software engineer, [employer], [employer]  Tobacco Use  . Smoking status: Never Smoker  . Smokeless tobacco: Never Used  Vaping Use  . Vaping Use: Never used  Substance and Sexual Activity  . Alcohol use: Yes    Comment: twice a week  . Drug use: No  . Sexual activity: Yes    Partners: Male    Birth control/protection: Surgical    Comment: hyst  Other Topics Concern  . Not on file  Social History Narrative   Lives w/ husband   Social Determinants of Health   Financial Resource Strain:   . Difficulty of Paying Living Expenses:   Food Insecurity:   . Worried About Charity fundraiser in the Last Year:   . Arboriculturist in the Last Year:   Transportation Needs:   . Film/video editor (Medical):   Marland Kitchen Lack of Transportation (Non-Medical):   Physical Activity:   . Days of Exercise per Week:   . Minutes of Exercise per Session:   Stress:   . Feeling of Stress :   Social Connections:   . Frequency of Communication with Friends and Family:   . Frequency of Social Gatherings with Friends and Family:   . Attends Religious Services:   . Active Member of Clubs or Organizations:   . Attends Archivist Meetings:   Marland Kitchen Marital Status:   Intimate Partner Violence:   . Fear of Current or Ex-Partner:   . Emotionally Abused:   Marland Kitchen Physically Abused:   . Sexually Abused:     Review of Systems: General: Negative for anorexia, weight loss, fever, chills, fatigue, weakness. Eyes: Negative for vision changes.  ENT: Negative for hoarseness, difficulty swallowing , nasal congestion. CV: Negative for chest pain, angina, palpitations, dyspnea on exertion, peripheral edema.  Respiratory: Negative for dyspnea at rest, dyspnea on exertion, cough,  sputum, wheezing.  GI: See history of present illness. GU:  Negative for dysuria, hematuria, urinary incontinence, urinary frequency, nocturnal urination.  MS: Negative for joint pain, low back pain.  Derm: Negative for rash or itching.  Neuro: Negative for weakness, abnormal sensation, seizure, frequent headaches, memory loss, confusion.  Psych: Negative for anxiety, depression, suicidal ideation, hallucinations.  Endo: Negative for unusual weight change.  Heme: Negative for bruising or bleeding. Allergy: Negative for rash or hives.  Physical Exam:  Vital signs in last 24 hours: Temp:  [98.7 F (37.1 C)] 98.7 F (37.1 C) (08/09 0712) Pulse Rate:  [63] 63 (08/09 0712) Resp:  [12] 12 (08/09 0712) BP: (144)/(77) 144/77 (08/09 0712) SpO2:  [98 %] 98 % (08/09 0712) Weight:  [86.2 kg] 86.2 kg (08/09 0712)   General:   Alert,  Well-developed, well-nourished, pleasant and cooperative in NAD Head:  Normocephalic and atraumatic. Eyes:  Sclera clear, no icterus.   Conjunctiva pink. Ears:  Normal auditory acuity. Nose:  No deformity, discharge,  or lesions. Mouth:  No deformity or lesions, dentition normal. Neck:  Supple; no masses or thyromegaly. Lungs:  Clear throughout to auscultation.   No wheezes, crackles, or rhonchi. No acute distress. Heart:  Regular rate and rhythm; no murmurs, clicks, rubs,  or gallops. Abdomen:  Soft, nontender and nondistended. No masses, hepatosplenomegaly or hernias noted. Normal bowel sounds, without guarding, and without rebound.   Rectal:  Deferred until time of colonoscopy.   Msk:  Symmetrical without gross deformities. Normal posture. Pulses:  Normal pulses noted. Extremities:  Without clubbing or edema. Neurologic:  Alert and  oriented x4;  grossly normal neurologically. Skin:  Intact without significant lesions or rashes. Cervical Nodes:  No significant cervical adenopathy. Psych:  Alert and cooperative. Normal mood and affect.  Intake/Output from  previous day: No intake/output data recorded. Intake/Output this shift: No intake/output data recorded.  Lab Results: No results for input(s): WBC, HGB, HCT, PLT in the last 72 hours. BMET No results for input(s): NA, K, CL, CO2, GLUCOSE, BUN, CREATININE, CALCIUM in the last 72 hours. LFT No results for input(s): PROT, ALBUMIN, AST, ALT, ALKPHOS, BILITOT, BILIDIR, IBILI in the last 72 hours. PT/INR No results for input(s): LABPROT, INR in the last 72 hours. Hepatitis Panel No results for input(s): HEPBSAG, HCVAB, HEPAIGM, HEPBIGM in the last 72 hours. C-Diff No results for input(s): CDIFFTOX in the last 72 hours.  Studies/Results: No results found.  Impression: Dysphagia, GERD  Plan: Proceed with EGD   LOS: 0 days     01/12/2020, 8:40 AM

## 2020-01-12 NOTE — Anesthesia Postprocedure Evaluation (Signed)
Anesthesia Post Note  Patient: Denise Taylor  Procedure(s) Performed: ESOPHAGOGASTRODUODENOSCOPY (EGD) WITH PROPOFOL (N/A ) BALLOON DILATION (N/A ) BIOPSY  Patient location during evaluation: Endoscopy Anesthesia Type: General Level of consciousness: awake, oriented, awake and alert and patient cooperative Pain management: pain level controlled Vital Signs Assessment: post-procedure vital signs reviewed and stable Respiratory status: spontaneous breathing, respiratory function stable and nonlabored ventilation Cardiovascular status: blood pressure returned to baseline and stable Postop Assessment: no headache and no backache Anesthetic complications: no   No complications documented.   Last Vitals:  Vitals:   01/12/20 0712  BP: (!) 144/77  Pulse: 63  Resp: 12  Temp: 37.1 C  SpO2: 98%    Last Pain:  Vitals:   01/12/20 0836  TempSrc:   PainSc: 0-No pain                 Tacy Learn

## 2020-01-12 NOTE — Transfer of Care (Signed)
Immediate Anesthesia Transfer of Care Note  Patient: Denise Taylor  Procedure(s) Performed: ESOPHAGOGASTRODUODENOSCOPY (EGD) WITH PROPOFOL (N/A ) BALLOON DILATION (N/A ) BIOPSY  Patient Location: Endoscopy Unit  Anesthesia Type:General  Level of Consciousness: awake, alert , oriented and patient cooperative  Airway & Oxygen Therapy: Patient Spontanous Breathing  Post-op Assessment: Report given to RN, Post -op Vital signs reviewed and stable and Patient moving all extremities  Post vital signs: Reviewed and stable  Last Vitals:  Vitals Value Taken Time  BP    Temp    Pulse    Resp    SpO2      Last Pain:  Vitals:   01/12/20 0836  TempSrc:   PainSc: 0-No pain      Patients Stated Pain Goal: 6 (59/74/16 3845)  Complications: No complications documented.

## 2020-01-12 NOTE — Op Note (Signed)
[facility] Patient Name: Denise Taylor Procedure Date: 01/12/2020 8:24 AM MRN: [id number] Date of Birth: 1961/05/22 Attending MD: Elon Alas. Abbey Chatters , MD CSN: [id number] Age: 59 Admit Type: Outpatient Procedure:                Upper GI endoscopy Indications:              Dysphagia Providers:                Elon Alas. Abbey Chatters, MD, Janeece Riggers, RN, Randa Spike, Technician Referring MD:              Medicines:                See the Anesthesia note for documentation of the                            administered medications Complications:            No immediate complications. Estimated Blood Loss:     Estimated blood loss was minimal. Procedure:                Pre-Anesthesia Assessment:                           - The anesthesia plan was to use monitored                            anesthesia care (MAC).                           After obtaining informed consent, the endoscope was                            passed under direct vision. Throughout the                            procedure, the patient's blood pressure, pulse, and                            oxygen saturations were monitored continuously. The                            [id number]-H190 ([id number]) scope was introduced through the                            mouth, and advanced to the second part of duodenum.                            The upper GI endoscopy was accomplished without                            difficulty. The patient tolerated the procedure                            well. Scope In: 8:43:58 AM Scope Out:  8:51:28 AM Total Procedure Duration: 0 hours 7 minutes 30 seconds  Findings:      Mucosal changes including ringed esophagus, feline appearance and       longitudinal furrows were found in the lower third of the esophagus.       Biopsies were taken with a cold forceps for histology.      The Z-line was irregular. Biopsies were taken with a cold forceps for       histology.       Localized mild inflammation characterized by erythema was found in the       gastric antrum.      The duodenal bulb, first portion of the duodenum and second portion of       the duodenum were normal.      One benign-appearing, intrinsic mild stenosis was found in the lower       third of the esophagus. The stenosis was traversed. A TTS dilator was       passed through the scope. Dilation with an 18-19-20 mm balloon dilator       was performed to 18 mm. The dilation site was examined and showed mild       improvement in luminal narrowing. Impression:               - Esophageal mucosal changes suggestive of                            eosinophilic esophagitis. Biopsied.                           - Z-line irregular. Biopsied.                           - Gastritis.                           - Normal duodenal bulb, first portion of the                            duodenum and second portion of the duodenum.                           - Benign-appearing esophageal stenosis. Dilated. Moderate Sedation:      Per Anesthesia Care Recommendation:           - Patient has a contact number available for                            emergencies. The signs and symptoms of potential                            delayed complications were discussed with the                            patient. Return to normal activities tomorrow.                            Written discharge instructions were provided to the  patient.                           - Resume previous diet.                           - Use Prilosec (omeprazole) 40 mg PO BID for 8                            weeks.                           - Await pathology results.                           - Repeat upper endoscopy PRN for retreatment.                           - Return to GI clinic in 4 weeks with Aliene Altes. Procedure Code(s):        --- Professional ---                           [phone],  Esophagogastroduodenoscopy, flexible,                            transoral; with transendoscopic balloon dilation of                            esophagus (less than 30 mm diameter)                           [id number], 59, Esophagogastroduodenoscopy, flexible,                            transoral; with biopsy, single or multiple Diagnosis Code(s):        --- Professional ---                           K22.8, Other specified diseases of esophagus                           K29.70, Gastritis, unspecified, without bleeding                           K22.2, Esophageal obstruction                           R13.10, Dysphagia, unspecified CPT copyright 2019 American Medical Association. All rights reserved. The codes documented in this report are preliminary and upon coder review may  be revised to meet current compliance requirements. Elon Alas. Abbey Chatters, [place] Abbey Chatters, MD 01/12/2020 9:25:36 AM This report has been signed electronically. Number of Addenda: 0

## 2020-01-12 NOTE — Anesthesia Preprocedure Evaluation (Signed)
Anesthesia Evaluation  Patient identified by MRN, date of birth, ID band Patient awake    Reviewed: Allergy & Precautions, H&P , NPO status , Patient's Chart, lab work & pertinent test results, reviewed documented beta blocker date and time   History of Anesthesia Complications (+) PONV  Airway Mallampati: II  TM Distance: >3 FB Neck ROM: full    Dental no notable dental hx.    Pulmonary neg pulmonary ROS,    Pulmonary exam normal breath sounds clear to auscultation       Cardiovascular Exercise Tolerance: Good negative cardio ROS   Rhythm:regular Rate:Normal     Neuro/Psych negative neurological ROS  negative psych ROS   GI/Hepatic Neg liver ROS, GERD  Medicated,  Endo/Other  negative endocrine ROS  Renal/GU negative Renal ROS  negative genitourinary   Musculoskeletal   Abdominal   Peds  Hematology negative hematology ROS (+)   Anesthesia Other Findings   Reproductive/Obstetrics negative OB ROS                             Anesthesia Physical Anesthesia Plan  ASA: II  Anesthesia Plan: General   Post-op Pain Management:    Induction:   PONV Risk Score and Plan: Propofol infusion  Airway Management Planned:   Additional Equipment:   Intra-op Plan:   Post-operative Plan:   Informed Consent: I have reviewed the patients History and Physical, chart, labs and discussed the procedure including the risks, benefits and alternatives for the proposed anesthesia with the patient or authorized representative who has indicated his/her understanding and acceptance.     Dental Advisory Given  Plan Discussed with: CRNA  Anesthesia Plan Comments:         Anesthesia Quick Evaluation

## 2020-01-13 LAB — SURGICAL PATHOLOGY

## 2020-01-15 ENCOUNTER — Encounter (HOSPITAL_COMMUNITY): Payer: Self-pay | Admitting: Internal Medicine

## 2020-03-03 ENCOUNTER — Ambulatory Visit: Payer: No Typology Code available for payment source | Admitting: Gastroenterology

## 2020-03-17 ENCOUNTER — Encounter: Payer: Self-pay | Admitting: Gastroenterology

## 2020-03-17 NOTE — Progress Notes (Deleted)
Referring Provider: Rory Percy, MD Primary Care Physician:  Rory Percy, MD Primary GI Physician: Dr. Abbey Chatters  No chief complaint on file.   HPI:   Denise Taylor is a 59 y.o. female with history of GERD, dysphagia, recurrent sigmoid diverticulitis s/p sigmoid colectomy January 2019 with last colonoscopy December 2018 without polyps, recommended repeat between 2023-2028. She is presenting today for follow-up of GERD and dysphagia s/p EGD.   Last seen in our office 12/11/19. Reported few years of solid food dysphagia with food regurgitation. GERD symptoms about once a week taking Prilosec as needed. Advised to resume Prilosec daily and proceed with EGD.   EGD 01/12/20: Esophageal mucosal changes suggestive of eosinophilic esophagitis, s/p biopsied, benign appearing esophageal stenosis s/p dilation, irregular Z line s/p biopsied, gastritis, normal examined duodenum. Recommended prilosec 40 mg BID x 8 weeks.   Gastric biopsy with fundic gland polyp, no H. Pylori. GE junction with increased intraepithelial eosinophils  and lymphocytes. Mid esophageal biopsy with increased intraepithelial eosinophils (up to 40/hpf). Dr Abbey Chatters recommended continuing with twice daily PPI, discuss elimination diet in office, and repeat EGD in 2-3 months for rebiopsies.   Today:    Past Medical History:  Diagnosis Date  . Complication of anesthesia    PONV  . Current use of estrogen therapy 02/13/2014  . Diverticulitis    4 episodes in the past 1-2 years.  . Diverticulitis   . Endometriosis   . GERD (gastroesophageal reflux disease)   . Indigestion 02/13/2014  . Parathyroid adenoma   . PONV (postoperative nausea and vomiting)   . Reactive airway disease    Following pneumonia x2 in 2019. Resolved.     Past Surgical History:  Procedure Laterality Date  . ABDOMINAL HYSTERECTOMY     partial  . BALLOON DILATION N/A 01/12/2020   Procedure: BALLOON DILATION;  Surgeon: Eloise Harman, DO;  [facility];  Service: Endoscopy;  Laterality: N/A;  . BIOPSY  01/12/2020   Procedure: BIOPSY;  Surgeon: Eloise Harman, DO;  [facility];  Service: Endoscopy;;  . BREAST REDUCTION SURGERY    . CHOLECYSTECTOMY  1999  . COLON SURGERY     8-12 inches removed for diverticulitis  . COLONOSCOPY  07/05/2012   Procedure: COLONOSCOPY;  Surgeon: Danie Binder, MD;  [facility];  Service: Endoscopy;  Laterality: N/A;  9:30  . COLONOSCOPY N/A 05/07/2017   Procedure: COLONOSCOPY;  Surgeon: Danie Binder, MD;  diverticulosis in the rectosigmoid colon, sigmoid colon, descending colon, and transverse colon, significant looping of the colon, external and internal hemorrhoids.  Recommended repeat exam in 5-10 years.   . endometrial mass    . ESOPHAGOGASTRODUODENOSCOPY  05/10/11   Benson: mild narrowing GE junction, Savory dilation  . ESOPHAGOGASTRODUODENOSCOPY (EGD) WITH PROPOFOL N/A 01/12/2020   Procedure: ESOPHAGOGASTRODUODENOSCOPY (EGD) WITH PROPOFOL;  Surgeon: Eloise Harman, DO;  [facility];  Service: Endoscopy;  Laterality: N/A;  8:30am  . FOOT SURGERY Left    shorten bone  . PARTIAL COLECTOMY N/A 07/02/2017   Procedure: PARTIAL COLECTOMY;  Surgeon: Aviva Signs, MD;  Location: [facility];  Service: General;  Laterality: N/A;  . RECONSTRUCTION OF NOSE    . SALPINGOOPHORECTOMY     right  . SKIN SURGERY     back of left leg  . THYROIDECTOMY Right 07/04/2019   Procedure: HEMITHYROIDECTOMY;  Surgeon: Leta Baptist, MD;  Location: [place];  Service: ENT;  Laterality: Right;  .  TONSILLECTOMY      Current Outpatient Medications  Medication Sig Dispense Refill  . EPIPEN 2-PAK 0.3 MG/0.3ML SOAJ injection Inject 0.3 mg into the muscle as needed (bee stings).     Marland Kitchen estradiol (VIVELLE-DOT) 0.0375 MG/24HR Place 1 patch onto the skin 2 (two) times a week. 24 patch 4  . omeprazole (PRILOSEC) 40 MG capsule Take 1 capsule (40 mg total) by mouth 2 (two) times daily  before a meal. Indigestion. 60 capsule 5  . ondansetron (ZOFRAN) 4 MG tablet Take 1 tablet (4 mg total) by mouth every 8 (eight) hours as needed for nausea or vomiting. 15 tablet 2   No current facility-administered medications for this visit.    Allergies as of 03/18/2020 - Review Complete 01/12/2020  Allergen Reaction Noted  . Bee venom Anaphylaxis 05/03/2017  . Codeine Nausea Only 02/17/2015    Family History  Problem Relation Age of Onset  . COPD Mother   . Hypertension Father   . Other Brother        paralyzed from waist down; fell out of deer stand  . Other Daughter        born with 2 holes in heart  . Heart disease Maternal Grandfather   . COPD Maternal Grandfather   . Stroke Paternal Grandmother   . Stroke Paternal Grandfather   . Colon cancer Cousin 46    Social History   Socioeconomic History  . Marital status: Married    Spouse name: Not on file  . Number of children: 2  . Years of education: Not on file  . Highest education level: Not on file  Occupational History  . Occupation: Software engineer, [employer], [employer]  Tobacco Use  . Smoking status: Never Smoker  . Smokeless tobacco: Never Used  Vaping Use  . Vaping Use: Never used  Substance and Sexual Activity  . Alcohol use: Yes    Comment: twice a week  . Drug use: No  . Sexual activity: Yes    Partners: Male    Birth control/protection: Surgical    Comment: hyst  Other Topics Concern  . Not on file  Social History Narrative   Lives w/ husband   Social Determinants of Health   Financial Resource Strain:   . Difficulty of Paying Living Expenses: Not on file  Food Insecurity:   . Worried About Charity fundraiser in the Last Year: Not on file  . Ran Out of Food in the Last Year: Not on file  Transportation Needs:   . Lack of Transportation (Medical): Not on file  . Lack of Transportation (Non-Medical): Not on file  Physical Activity:   . Days of Exercise per Week: Not on file  . Minutes of Exercise  per Session: Not on file  Stress:   . Feeling of Stress : Not on file  Social Connections:   . Frequency of Communication with Friends and Family: Not on file  . Frequency of Social Gatherings with Friends and Family: Not on file  . Attends Religious Services: Not on file  . Active Member of Clubs or Organizations: Not on file  . Attends Archivist Meetings: Not on file  . Marital Status: Not on file    Review of Systems: Gen: Denies fever, chills, anorexia. Denies fatigue, weakness, weight loss.  CV: Denies chest pain, palpitations, syncope, peripheral edema, and claudication. Resp: Denies dyspnea at rest, cough, wheezing, coughing up blood, and pleurisy. GI: Denies vomiting blood, jaundice, and fecal incontinence.  Denies dysphagia or odynophagia. Derm: Denies rash, itching, dry skin Psych: Denies depression, anxiety, memory loss, confusion. No homicidal or suicidal ideation.  Heme: Denies bruising, bleeding, and enlarged lymph nodes.  Physical Exam: There were no vitals taken for this visit. General:   Alert and oriented. No distress noted. Pleasant and cooperative.  Head:  Normocephalic and atraumatic. Eyes:  Conjuctiva clear without scleral icterus. Mouth:  Oral mucosa pink and moist. Good dentition. No lesions. Heart:  S1, S2 present without murmurs appreciated. Lungs:  Clear to auscultation bilaterally. No wheezes, rales, or rhonchi. No distress.  Abdomen:  +BS, soft, non-tender and non-distended. No rebound or guarding. No HSM or masses noted. Msk:  Symmetrical without gross deformities. Normal posture. Extremities:  Without edema. Neurologic:  Alert and  oriented x4 Psych:  Alert and cooperative. Normal mood and affect.

## 2020-03-18 ENCOUNTER — Ambulatory Visit: Payer: No Typology Code available for payment source | Admitting: Gastroenterology

## 2020-05-03 ENCOUNTER — Encounter: Payer: Self-pay | Admitting: Gastroenterology

## 2020-05-03 NOTE — Progress Notes (Signed)
Referring Provider: Rory Percy, MD Primary Care Physician:  Rory Percy, MD Primary GI Physician: Dr. Abbey Chatters  Chief Complaint  Patient presents with  . Gastroesophageal Reflux    doing ok    HPI:   Denise Taylor is a 59 y.o. female presenting today for follow-up of GERD and dysphagia s/p EGD.  She has history of GERD, dysphagia requiring dilations, recurrent sigmoid diverticulitis s/p sigmoid colectomy in January 2019, last colonoscopy December 2018 with diverticulosis in the rectosigmoid colon, sigmoid colon, descending colon, transverse colon, significant looping of the colon, external and internal hemorrhoids with recommendations to repeat in 5-10 years.  Last seen in our office 12/11/2019.  Continued with solid food dysphagia requiring regurgitation.  GERD symptoms about once a week on Prilosec as needed.  No significant lower GI symptoms.  Plan for EGD and to resume Prilosec 20 mg daily.  EGD 01/12/2020: Esophageal mucosal changes suggestive of eosinophilic esophagitis s/p biopsy, benign-appearing esophageal stenosis s/p dilation, irregular Z-line biopsied, gastritis, normal examined duodenum.  Esophageal biopsy with increased intraepithelial eosinophils (up to 40/hpf), GE junction biopsy with increased intraepithelial eosinophils and lymphocytes without metaplasia, dysplasia, or malignancy, gastric biopsy with fundic gland polyp, no H. Pylori.  Recommended omeprazole 40 mg twice daily, follow-up in office to discuss elimination diet, and repeat EGD for rebiopsies in 2-3 months.  Patient canceled follow-up appointment in September and October.  Today: Doesn't want to have EGD at this time. Lost her job and is currently on her husbands insurance which is not very good. Will not be able to have EGD for 6 months. 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  She was laid off due to conflict of interest related to her and her husband  opening a pharmacy.  GERD is well controlled on omeprazole 40 mg twice daily.  Needs a refill. When she was taking omeprazole 20 mg daily, she continued with breakthrough GERD symptoms.  Dysphagia has resolved. No nausea or vomiting. No abdominal pain.  No longer eating eggs as she has noticed to give her reflux.  No known food allergies. Drinks a lot of milk. Doesn't eat much bread. Rare pasta.   No lower GI symptoms. BMs daily. No brbpr or melena.   Taking zofran for migraines as needed.   Past Medical History:  Diagnosis Date  . Complication of anesthesia    PONV  . Current use of estrogen therapy 02/13/2014  . Diverticulitis    4 episodes in the past 1-2 years.  . Diverticulitis   . Endometriosis   . GERD (gastroesophageal reflux disease)   . Indigestion 02/13/2014  . Parathyroid adenoma   . PONV (postoperative nausea and vomiting)   . Reactive airway disease    Following pneumonia x2 in 2019. Resolved.     Past Surgical History:  Procedure Laterality Date  . ABDOMINAL HYSTERECTOMY     partial  . BALLOON DILATION N/A 01/12/2020   Procedure: BALLOON DILATION;  Surgeon: Eloise Harman, DO;  [facility];  Service: Endoscopy;  Laterality: N/A;  . BIOPSY  01/12/2020   Procedure: BIOPSY;  Surgeon: Eloise Harman, DO;  [facility];  Service: Endoscopy;;  . BREAST REDUCTION SURGERY    . CHOLECYSTECTOMY  1999  . COLON SURGERY     8-12 inches removed for diverticulitis  . COLONOSCOPY  07/05/2012   Procedure: COLONOSCOPY;  Surgeon: Danie Binder, MD;  [facility];  Service: Endoscopy;  Laterality: N/A;  9:30  . COLONOSCOPY N/A 05/07/2017   Procedure: COLONOSCOPY;  Surgeon: Danie Binder, MD;  diverticulosis in the rectosigmoid colon, sigmoid colon, descending colon, and transverse colon, significant looping of the colon, external and internal hemorrhoids.  Recommended repeat exam in 5-10 years.   . endometrial mass    .  ESOPHAGOGASTRODUODENOSCOPY  05/10/11   Benson: mild narrowing GE junction, Savory dilation  . ESOPHAGOGASTRODUODENOSCOPY (EGD) WITH PROPOFOL N/A 01/12/2020   Procedure: ESOPHAGOGASTRODUODENOSCOPY (EGD) WITH PROPOFOL;  Surgeon: Eloise Harman, DO;  Esophageal mucosal changes suggestive of eosinophilic esophagitis s/p biopsy (consistent with EOE), benign-appearing esophageal stenosis s/p dilation, irregular Z-line biopsied (no metaplasia or dysplasia), gastritis s/p biopsy (fundic gland polyp, no H. pylori), normal examined duodenum.  Marland Kitchen FOOT SURGERY Left    shorten bone  . PARTIAL COLECTOMY N/A 07/02/2017   Procedure: PARTIAL COLECTOMY;  Surgeon: Aviva Signs, MD;  Location: [facility];  Service: General;  Laterality: N/A;  . RECONSTRUCTION OF NOSE    . SALPINGOOPHORECTOMY     right  . SKIN SURGERY     back of left leg  . THYROIDECTOMY Right 07/04/2019   Procedure: HEMITHYROIDECTOMY;  Surgeon: Leta Baptist, MD;  Location: [place];  2 cm right posterior thyroid mass.  Pathology revealed nodular hyperplasia, benign.  . TONSILLECTOMY      Current Outpatient Medications  Medication Sig Dispense Refill  . EPIPEN 2-PAK 0.3 MG/0.3ML SOAJ injection Inject 0.3 mg into the muscle as needed (bee stings).     Marland Kitchen estradiol (VIVELLE-DOT) 0.0375 MG/24HR Place 1 patch onto the skin 2 (two) times a week. 24 patch 4  . ondansetron (ZOFRAN) 4 MG tablet Take 1 tablet (4 mg total) by mouth every 8 (eight) hours as needed for nausea or vomiting. 15 tablet 2  . omeprazole (PRILOSEC) 40 MG capsule Take 1 capsule (40 mg total) by mouth 2 (two) times daily before a meal. 60 capsule 5   No current facility-administered medications for this visit.    Allergies as of 05/05/2020 - Review Complete 05/05/2020  Allergen Reaction Noted  . Bee venom Anaphylaxis 05/03/2017  . Codeine Nausea Only 02/17/2015    Family History  Problem Relation Age of Onset  . COPD Mother   . Hypertension Father   . Other  Brother        paralyzed from waist down; fell out of deer stand  . Other Daughter        born with 2 holes in heart  . Heart disease Maternal Grandfather   . COPD Maternal Grandfather   . Stroke Paternal Grandmother   . Stroke Paternal Grandfather   . Colon cancer Cousin 49  . Colon cancer Cousin 32    Social History   Socioeconomic History  . Marital status: Married    Spouse name: Not on file  . Number of children: 2  . Years of education: Not on file  . Highest education level: Not on file  Occupational History  . Occupation: Software engineer, [employer], [employer]  Tobacco Use  . Smoking status: Never Smoker  . Smokeless tobacco: Never Used  Vaping Use  . Vaping Use: Never used  Substance and Sexual Activity  . Alcohol use: Yes    Comment: twice a week  . Drug use: No  . Sexual activity: Yes    Partners: Male    Birth control/protection: Surgical    Comment: hyst  Other Topics Concern  .  Not on file  Social History Narrative   Lives w/ husband   Social Determinants of Health   Financial Resource Strain:   . Difficulty of Paying Living Expenses: Not on file  Food Insecurity:   . Worried About Charity fundraiser in the Last Year: Not on file  . Ran Out of Food in the Last Year: Not on file  Transportation Needs:   . Lack of Transportation (Medical): Not on file  . Lack of Transportation (Non-Medical): Not on file  Physical Activity:   . Days of Exercise per Week: Not on file  . Minutes of Exercise per Session: Not on file  Stress:   . Feeling of Stress : Not on file  Social Connections:   . Frequency of Communication with Friends and Family: Not on file  . Frequency of Social Gatherings with Friends and Family: Not on file  . Attends Religious Services: Not on file  . Active Member of Clubs or Organizations: Not on file  . Attends Archivist Meetings: Not on file  . Marital Status: Not on file    Review of Systems: Gen: Denies fever, chills, cold or  flulike symptoms, lightheadedness, dizziness, presyncope, syncope. CV: Denies chest pain. Admits to palpitations related anxiety.  Resp: Denies dyspnea or cough.  GI: See HPI. Heme: See HPI  Physical Exam: BP 140/87   Pulse 85   Temp (!) 96.8 F (36 C) (Temporal)   Ht 5\' 3"  (1.6 m)   Wt 195 lb 9.6 oz (88.7 kg)   BMI 34.65 kg/m  General:   Alert and oriented. No distress noted. Pleasant and cooperative.  Head:  Normocephalic and atraumatic. Eyes:  Conjuctiva clear without scleral icterus. Heart:  S1, S2 present without murmurs appreciated. Lungs:  Clear to auscultation bilaterally. No wheezes, rales, or rhonchi. No distress.  Abdomen:  +BS, soft, non-tender and non-distended. No rebound or guarding. No HSM or masses noted. Msk:  Symmetrical without gross deformities. Normal posture. Extremities:  Without edema. Neurologic:  Alert and  oriented x4 Psych: Normal mood and affect.

## 2020-05-05 ENCOUNTER — Encounter: Payer: Self-pay | Admitting: Gastroenterology

## 2020-05-05 ENCOUNTER — Encounter: Payer: Self-pay | Admitting: Internal Medicine

## 2020-05-05 ENCOUNTER — Ambulatory Visit: Payer: BC Managed Care – PPO | Admitting: Gastroenterology

## 2020-05-05 ENCOUNTER — Other Ambulatory Visit: Payer: Self-pay

## 2020-05-05 VITALS — BP 140/87 | HR 85 | Temp 96.8°F | Ht 63.0 in | Wt 195.6 lb

## 2020-05-05 DIAGNOSIS — R1310 Dysphagia, unspecified: Secondary | ICD-10-CM

## 2020-05-05 DIAGNOSIS — K219 Gastro-esophageal reflux disease without esophagitis: Secondary | ICD-10-CM

## 2020-05-05 DIAGNOSIS — K200 Eosinophilic esophagitis: Secondary | ICD-10-CM

## 2020-05-05 MED ORDER — OMEPRAZOLE 40 MG PO CPDR: 40.0000 mg | DELAYED_RELEASE_CAPSULE | Freq: Two times a day (BID) | ORAL | 5 refills | Status: DC

## 2020-05-05 NOTE — Progress Notes (Signed)
CC'ED TO PCP 

## 2020-05-05 NOTE — Assessment & Plan Note (Addendum)
59 year old female with history of intermittent dysphagia for several years.  Recent EGD 01/12/2020 with esophageal mucosal changes suggestive of EOE and benign-appearing esophageal stenosis s/p biopsy and dilation.  Pathology with increased intraepithelial eosinophils (up to 40/hpf).  Omeprazole was increased from 20 mg daily to 40 mg twice daily with resolution of dysphagia symptoms.  Notably, patient does have chronic history of GERD and states symptoms were not well controlled on omeprazole 20 mg daily but are very well controlled at this time.  Dr. Abbey Chatters had recommended repeat EGD in 2-3 months to rebiopsy.  Patient is unable to have EGD at this time due to recent loss of job and change in insurance.  States she will need to have this done in about 6 months.   We did discuss elimination diets used for EOE.  Patient does not have any known food allergies.  Notes eggs will give her reflux and is no longer eating eggs. As symptoms have resolved, I do not feel following an elimination diet will be very helpful. This will likely cause unnecessary food aversion. Discussed referral to allergist for definitive testing, but patient will also have to hold off on this for another 6 months.  Plan: Continue omeprazole 40 mg twice daily 30 minutes before breakfast and dinner for now. Provided information on four most common food groups that have been associated with EOE (milk, egg, soy +/- other legumes, and wheat).  Continue to monitor for return of symptoms.  Follow-up in 6 months to discuss repeat EGD.

## 2020-05-05 NOTE — Progress Notes (Signed)
Cc'ed to pcp °

## 2020-05-05 NOTE — Assessment & Plan Note (Addendum)
Well-controlled on omeprazole 40 mg twice daily.  Plan to continue current medications for now.  Follow-up in 6 months.

## 2020-05-05 NOTE — Assessment & Plan Note (Signed)
Addressed under dysphagia.

## 2020-05-05 NOTE — Patient Instructions (Addendum)
Continue taking omeprazole 40 mg twice daily 30 minutes before breakfast and dinner for now.   As we discussed, the 4 most common food groups to trigger increased eosinophils in your esophagus are: Milk, egg, soy +/- other legumes, and wheat.   We will plan to see you back in 6 months to discuss repeat EGD.   Aliene Altes, PA-C [facility]

## 2020-06-15 ENCOUNTER — Other Ambulatory Visit: Payer: Self-pay | Admitting: Gastroenterology

## 2020-06-15 DIAGNOSIS — K219 Gastro-esophageal reflux disease without esophagitis: Secondary | ICD-10-CM

## 2020-07-01 IMAGING — CT CT CERVICAL SPINE W/O CM
4 series · 15 of 33 positions shown, 18 images · non-contrast
Comparison: None.

CLINICAL DATA: Status post fall.

EXAM:
CT CERVICAL SPINE WITHOUT CONTRAST
TECHNIQUE: Multidetector CT imaging of the cervical spine was performed without
intravenous contrast. Multiplanar CT image reconstructions were also
generated.

[Series 4: c spine soft · axial · 0.34mm/px · z∈[+1285,+1315]mm · 2 of 91 slices shown]
[im 16/91  soft-tissue]
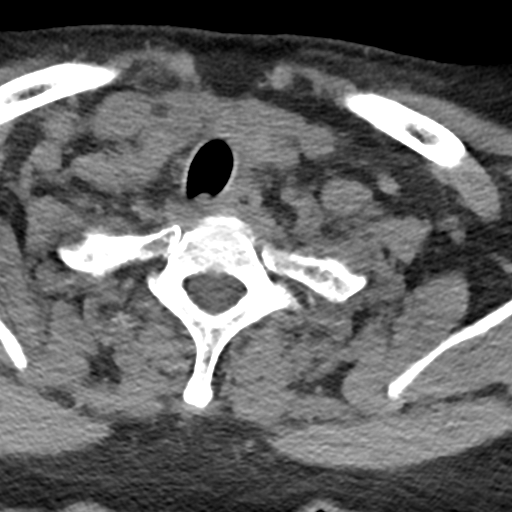
[im 31/91  soft-tissue]
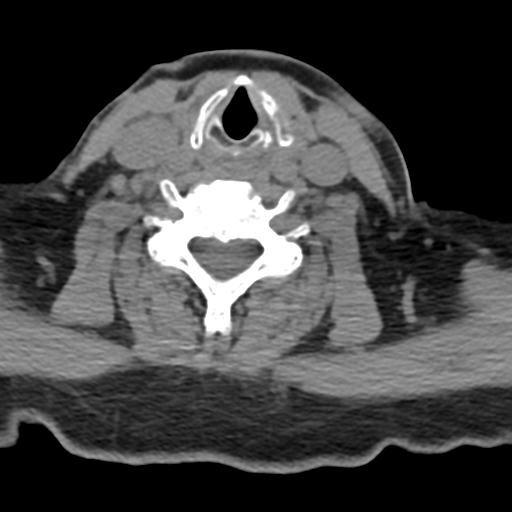

[Series 5: sag bone · sagittal · 0.31mm/px · 5 of 89 slices shown, 6 images]
[im 30/89  bone]
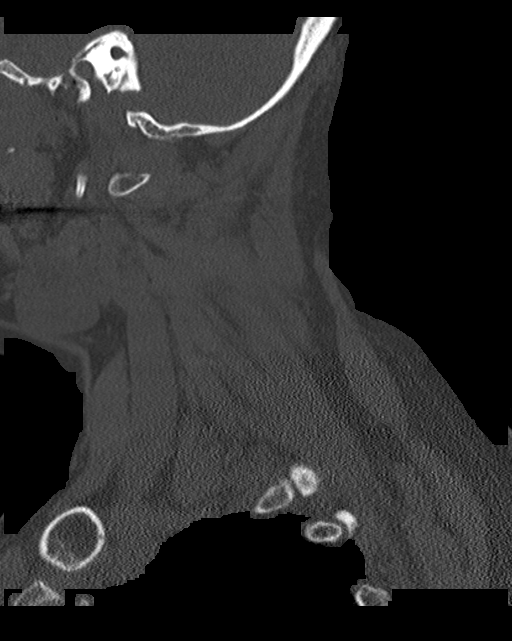
[im 37/89  bone]
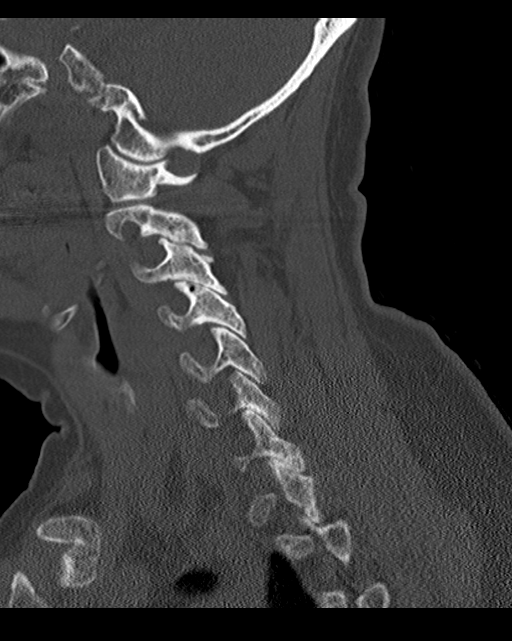
[im 45/89  soft-tissue]
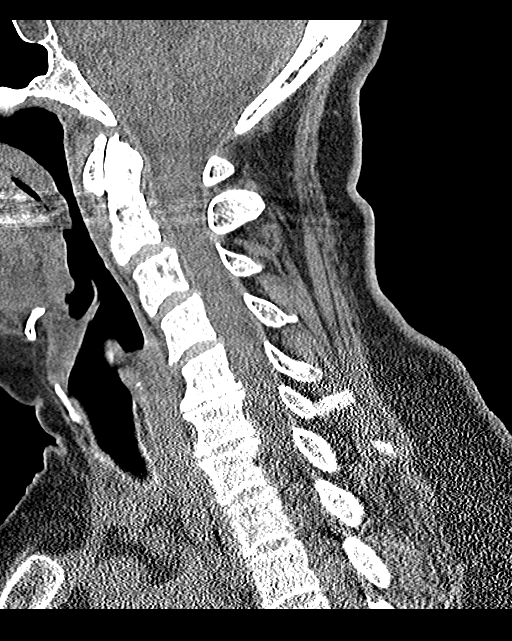
[im 45/89  bone]
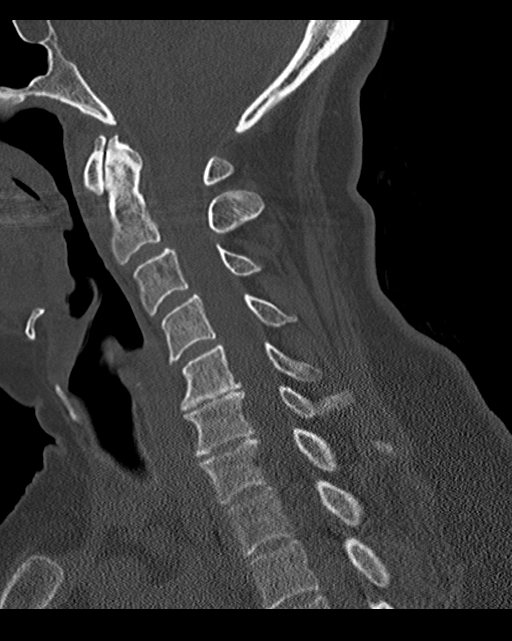
[im 52/89  bone]
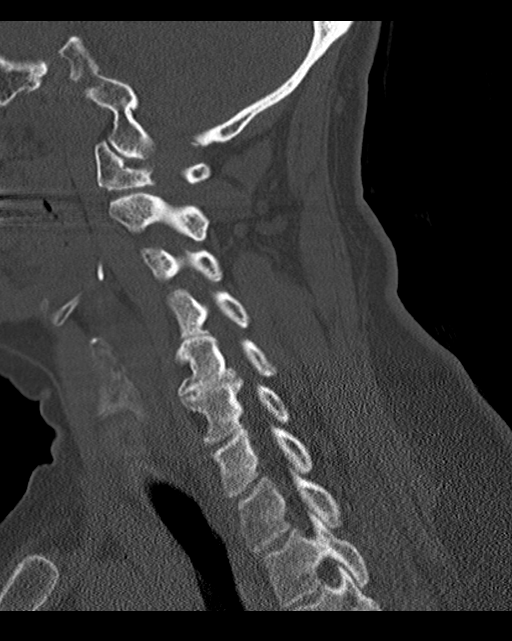
[im 59/89  bone]
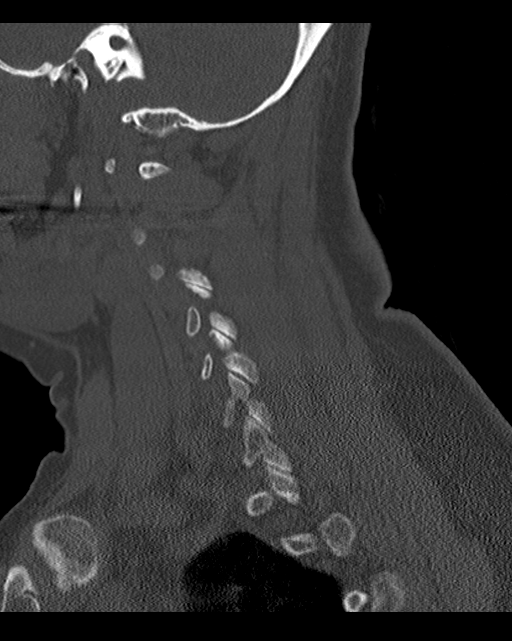

[Series 6: cor bone · coronal · 0.37mm/px · 3 of 73 slices shown]
[im 19/73  bone]
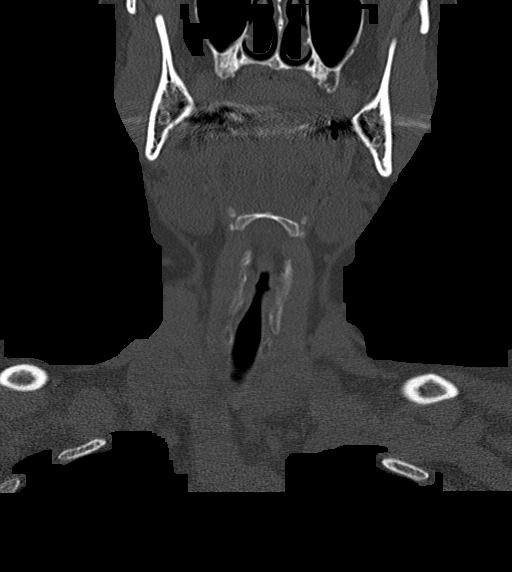
[im 31/73  bone]
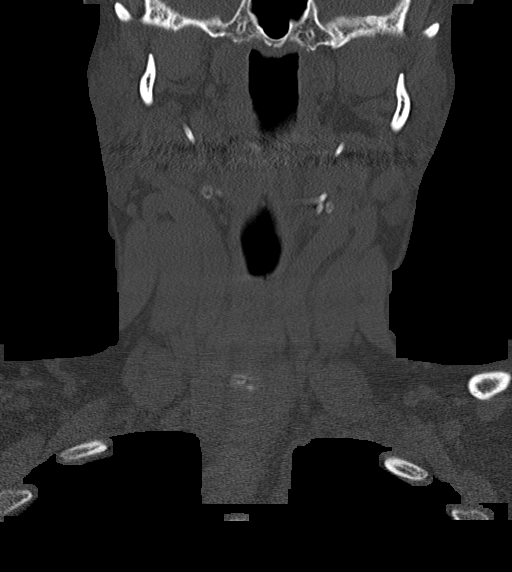
[im 43/73  bone]
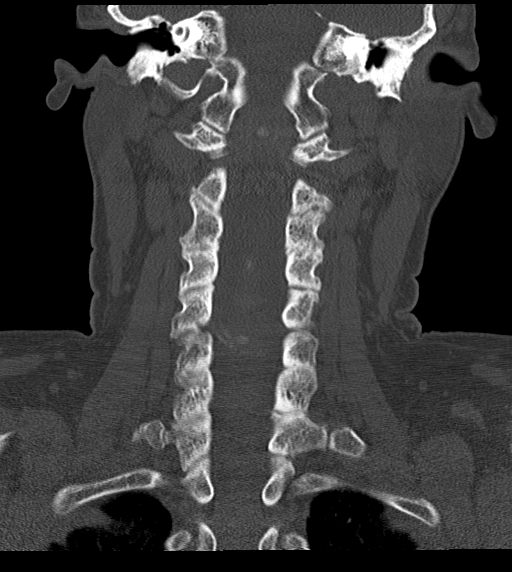

[Series 7: orthogonal axials · axial · 0.21mm/px · z∈[+1272,+1370]mm · 5 of 88 slices shown, 7 images]
[im 15/88  soft-tissue]
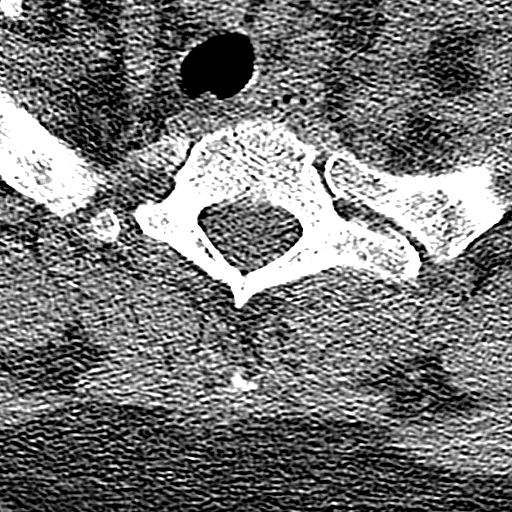
[im 15/88  bone]
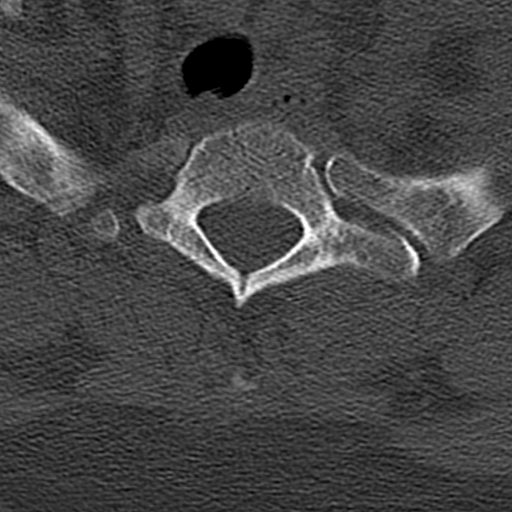
[im 30/88  bone]
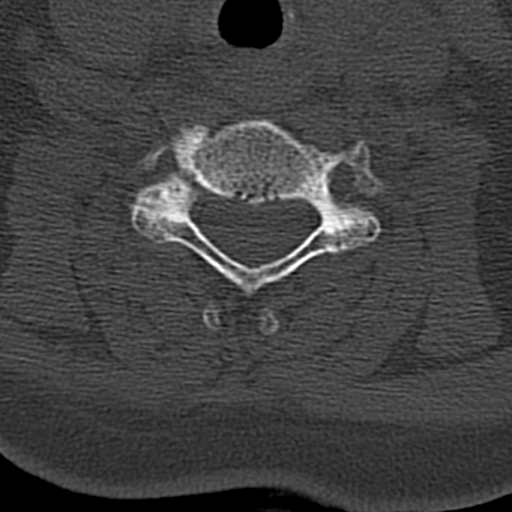
[im 44/88  bone]
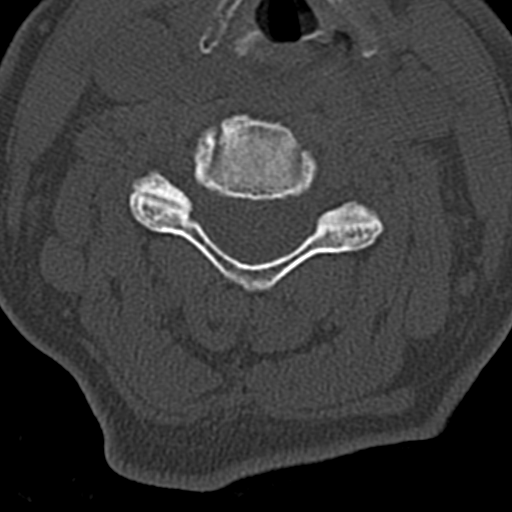
[im 59/88  bone]
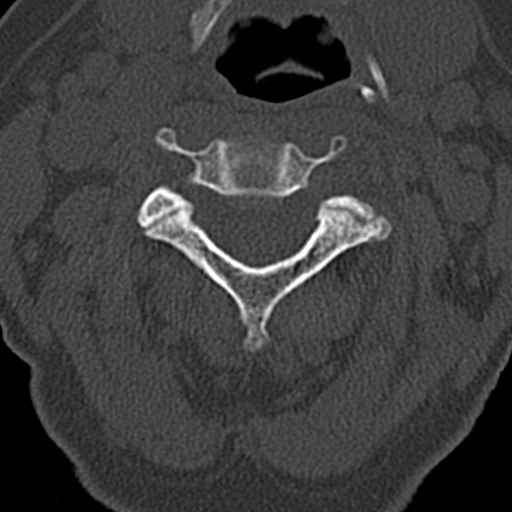
[im 73/88  soft-tissue]
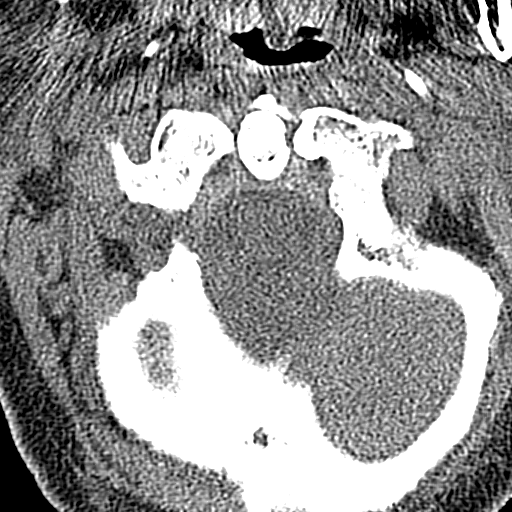
[im 73/88  bone]
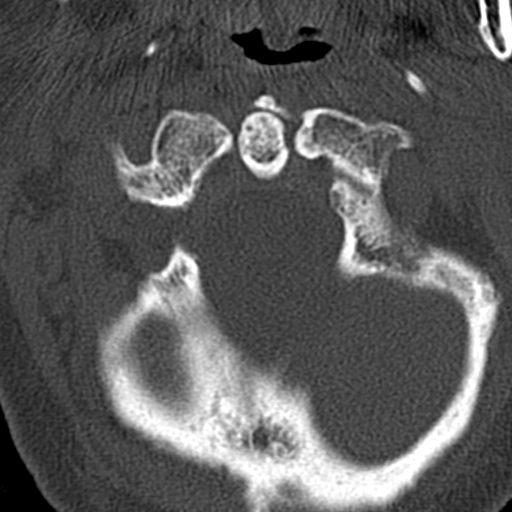

[15 of 33 positions shown; findings below may reference images not displayed]

FINDINGS: Alignment: Normal.

Skull base and vertebrae: No acute fracture. No primary bone lesion
or focal pathologic process.

Soft tissues and spinal canal: No prevertebral fluid or swelling. No
visible canal hematoma.

Disc levels: Moderate severity endplate sclerosis is seen at the
level of C5-C6 with mild to moderate severity endplate sclerosis
noted at the level of C6-C7. Mild anterior osteophyte formation is
seen at the level of C4-C5.

Marked severity intervertebral disc space narrowing is seen at the
level of C5-C6 with moderate severity intervertebral disc space
narrowing noted at the level of C6-C7.

There is mild bilateral multilevel facet joint hypertrophy.

Upper chest: Negative.

Other: None.
IMPRESSION: 1. No acute fracture within the cervical spine.
2. Marked severity degenerative changes at the level of C5-C6 and
C6-C7.

## 2020-07-10 ENCOUNTER — Other Ambulatory Visit: Payer: Self-pay | Admitting: Adult Health

## 2020-10-31 NOTE — Progress Notes (Deleted)
Referring Provider: Rory Percy, MD Primary Care Physician:  Rory Percy, MD Primary GI Physician: Dr. Abbey Chatters  No chief complaint on file.   HPI:   Denise Taylor is a 59 y.o. female presenting today  for follow-up of GERD and EOE.   EGD 01/12/2020: Esophageal mucosal changes suggestive of eosinophilic esophagitis s/p biopsy, benign-appearing esophageal stenosis s/p dilation, irregular Z-line biopsied, gastritis, normal examined duodenum.  Esophageal biopsy with increased intraepithelial eosinophils (up to 40/hpf), GE junction biopsy with increased intraepithelial eosinophils and lymphocytes without metaplasia, dysplasia, or malignancy, gastric biopsy with fundic gland polyp, no H. Pylori. Recommended repeat EGD for rebiopsies in 2-3 months which has not been completed.  She was last seen in our office 05/05/2020.  Dysphagia had resolved.  GERD was well controlled on omeprazole 40 mg twice daily.  Also stated she was no longer eating eggs as she noticed this gave her reflux.  No known food allergies.  Drinks a lot of milk.  No other significant GI symptoms.  She was not able to have repeat EGD at that time due to losing her job.  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  We also discussed referral to an allergist for definitive allergy testing, but patient will have to hold off on this as well.  She was provided information on for most common food groups that have been associated with EOE.  Plan to continue her current medications and follow-up in 6 months.  Today:   Past Medical History:  Diagnosis Date  . Complication of anesthesia    PONV  . Current use of estrogen therapy 02/13/2014  . Diverticulitis    4 episodes in the past 1-2 years.  . Diverticulitis   . Endometriosis   . GERD (gastroesophageal reflux disease)   . Indigestion 02/13/2014  . Parathyroid adenoma   . PONV (postoperative nausea and  vomiting)   . Reactive airway disease    Following pneumonia x2 in 2019. Resolved.     Past Surgical History:  Procedure Laterality Date  . ABDOMINAL HYSTERECTOMY     partial  . BALLOON DILATION N/A 01/12/2020   Procedure: BALLOON DILATION;  Surgeon: Eloise Harman, DO;  Location: [facility];  Service: Endoscopy;  Laterality: N/A;  . BIOPSY  01/12/2020   Procedure: BIOPSY;  Surgeon: Eloise Harman, DO;  Location: [facility];  Service: Endoscopy;;  . BREAST REDUCTION SURGERY    . CHOLECYSTECTOMY  1999  . COLON SURGERY     8-12 inches removed for diverticulitis  . COLONOSCOPY  07/05/2012   Procedure: COLONOSCOPY;  Surgeon: Danie Binder, MD;  Location: [facility];  Service: Endoscopy;  Laterality: N/A;  9:30  . COLONOSCOPY N/A 05/07/2017   Procedure: COLONOSCOPY;  Surgeon: Danie Binder, MD;  diverticulosis in the rectosigmoid colon, sigmoid colon, descending colon, and transverse colon, significant looping of the colon, external and internal hemorrhoids.  Recommended repeat exam in 5-10 years.   . endometrial mass    . ESOPHAGOGASTRODUODENOSCOPY  05/10/11   Benson: mild narrowing GE junction, Savory dilation  . ESOPHAGOGASTRODUODENOSCOPY (EGD) WITH PROPOFOL N/A 01/12/2020   Procedure: ESOPHAGOGASTRODUODENOSCOPY (EGD) WITH PROPOFOL;  Surgeon: Eloise Harman, DO;  Esophageal mucosal changes suggestive of eosinophilic esophagitis s/p biopsy (consistent with EOE), benign-appearing esophageal stenosis s/p dilation, irregular Z-line biopsied (no metaplasia or dysplasia), gastritis s/p biopsy (fundic gland polyp, no H.  pylori), normal examined duodenum.  Marland Kitchen FOOT SURGERY Left    shorten bone  . PARTIAL COLECTOMY N/A 07/02/2017   Procedure: PARTIAL COLECTOMY;  Surgeon: Aviva Signs, MD;  Location: [facility];  Service: General;  Laterality: N/A;  . RECONSTRUCTION OF NOSE    . SALPINGOOPHORECTOMY     right  . SKIN SURGERY     back of left leg  . THYROIDECTOMY Right 07/04/2019    Procedure: HEMITHYROIDECTOMY;  Surgeon: Leta Baptist, MD;  Location: [facility];  2 cm right posterior thyroid mass.  Pathology revealed nodular hyperplasia, benign.  . TONSILLECTOMY      Current Outpatient Medications  Medication Sig Dispense Refill  . EPIPEN 2-PAK 0.3 MG/0.3ML SOAJ injection Inject 0.3 mg into the muscle as needed (bee stings).     Marland Kitchen estradiol (VIVELLE-DOT) 0.0375 MG/24HR PLACE 1 PATCH ONTO THE SKIN 2 (TWO) TIMES A WEEK. 24 patch 0  . omeprazole (PRILOSEC) 40 MG capsule Take 1 capsule (40 mg total) by mouth 2 (two) times daily before a meal. 60 capsule 5  . ondansetron (ZOFRAN) 4 MG tablet Take 1 tablet (4 mg total) by mouth every 8 (eight) hours as needed for nausea or vomiting. 15 tablet 2   No current facility-administered medications for this visit.    Allergies as of 11/03/2020 - Review Complete 05/05/2020  Allergen Reaction Noted  . Bee venom Anaphylaxis 05/03/2017  . Codeine Nausea Only 02/17/2015    Family History  Problem Relation Age of Onset  . COPD Mother   . Hypertension Father   . Other Brother        paralyzed from waist down; fell out of deer stand  . Other Daughter        born with 2 holes in heart  . Heart disease Maternal Grandfather   . COPD Maternal Grandfather   . Stroke Paternal Grandmother   . Stroke Paternal Grandfather   . Colon cancer Cousin 17  . Colon cancer Cousin 65    Social History   Socioeconomic History  . Marital status: Married    Spouse name: Not on file  . Number of children: 2  . Years of education: Not on file  . Highest education level: Not on file  Occupational History  . Occupation: Software engineer, [employer], [employer]  Tobacco Use  . Smoking status: Never Smoker  . Smokeless tobacco: Never Used  Vaping Use  . Vaping Use: Never used  Substance and Sexual Activity  . Alcohol use: Yes    Comment: twice a week  . Drug use: No  . Sexual activity: Yes    Partners: Male    Birth control/protection:  Surgical    Comment: hyst  Other Topics Concern  . Not on file  Social History Narrative   Lives w/ husband   Social Determinants of Health   Financial Resource Strain: Not on file  Food Insecurity: Not on file  Transportation Needs: Not on file  Physical Activity: Not on file  Stress: Not on file  Social Connections: Not on file    Review of Systems: Gen: Denies fever, chills, anorexia. Denies fatigue, weakness, weight loss.  CV: Denies chest pain, palpitations, syncope, peripheral edema, and claudication. Resp: Denies dyspnea at rest, cough, wheezing, coughing up blood, and pleurisy. GI: Denies vomiting blood, jaundice, and fecal incontinence.   Denies dysphagia or odynophagia. Derm: Denies rash, itching, dry skin Psych: Denies depression, anxiety, memory loss, confusion. No homicidal or suicidal ideation.  Heme: Denies bruising,  bleeding, and enlarged lymph nodes.  Physical Exam: There were no vitals taken for this visit. General:   Alert and oriented. No distress noted. Pleasant and cooperative.  Head:  Normocephalic and atraumatic. Eyes:  Conjuctiva clear without scleral icterus. Mouth:  Oral mucosa pink and moist. Good dentition. No lesions. Heart:  S1, S2 present without murmurs appreciated. Lungs:  Clear to auscultation bilaterally. No wheezes, rales, or rhonchi. No distress.  Abdomen:  +BS, soft, non-tender and non-distended. No rebound or guarding. No HSM or masses noted. Msk:  Symmetrical without gross deformities. Normal posture. Extremities:  Without edema. Neurologic:  Alert and  oriented x4 Psych:  Alert and cooperative. Normal mood and affect.

## 2020-11-03 ENCOUNTER — Ambulatory Visit: Payer: Self-pay | Admitting: Gastroenterology

## 2021-06-23 ENCOUNTER — Other Ambulatory Visit: Payer: Self-pay | Admitting: Gastroenterology

## 2021-06-23 DIAGNOSIS — K200 Eosinophilic esophagitis: Secondary | ICD-10-CM

## 2021-06-23 DIAGNOSIS — K219 Gastro-esophageal reflux disease without esophagitis: Secondary | ICD-10-CM

## 2022-06-13 ENCOUNTER — Encounter: Payer: Self-pay | Admitting: Internal Medicine

## 2023-10-19 ENCOUNTER — Ambulatory Visit: Payer: Self-pay

## 2023-10-19 NOTE — Telephone Encounter (Signed)
  Chief Complaint: SOB Symptoms: wheezing, cough Frequency: x 1 year Pertinent Negatives: Patient denies fever, URI sx, severe SOB, CP Disposition: [] ED /[] Urgent Care (no appt availability in office) / [] Appointment(In office/virtual)/ []  [facility]/ [] Home Care/ [] Refused Recommended Disposition /[] [facility]/ [x]  Follow-up with PCP Additional Notes: Pt calling to establish care with [facility]. PT was tx to nurse triage d/t being symptomatic. Upon further investigation, pt has been experiencing SOB, wheezing, and cough x 1 year. Pt reports used to having a specialist and has been dx with RAD, but MD retired. Since then, pt has been utilizing PCP/UC and has been on several rounds of abx/steroids. Pt does endorse using albuterol  INH PRN that provides sx relief. Scheduled New Pt appt for soonest appt available on  11/23/2023, and advised pt to continue to F/U with PCP for symptomatic care until specialist is established. Patient verbalized understanding and to call back with worsening symptoms.      Copied from CRM [id number]. Topic: Clinical - Red Word Triage >> Oct 19, 2023 11:23 AM Tyronne Galloway wrote: Red Word that prompted transfer to Nurse Triage: Pt is a new pt with a referral from Dr. Marykay Snipes no appt scheduled. Pt is experiencing shortness of breath, coughing, and wheezing for about a year. Reason for Disposition  [1] MILD longstanding difficulty breathing AND [2]  SAME as normal  Answer Assessment - Initial Assessment Questions 1. RESPIRATORY STATUS: "Describe your breathing?" (e.g., wheezing, shortness of breath, unable to speak, severe coughing)      SOB, coughing wheezing Reports going to doctors x 3 with abx and steroids - no relief 2. ONSET: "When did this breathing problem begin?"      X 1 year 3. PATTERN "Does the difficult breathing come and go, or has it been constant since it started?"      Constant Affects sleeping 4. SEVERITY: "How bad is your breathing?"  (e.g., mild, moderate, severe)    - MILD: No SOB at rest, mild SOB with walking, speaks normally in sentences, can lie down, no retractions, pulse < 100.    - MODERATE: SOB at rest, SOB with minimal exertion and prefers to sit, cannot lie down flat, speaks in phrases, mild retractions, audible wheezing, pulse 100-120.    - SEVERE: Very SOB at rest, speaks in single words, struggling to breathe, sitting hunched forward, retractions, pulse > 120      Mild - moderate, SOB depends on environment 5. RECURRENT SYMPTOM: "Have you had difficulty breathing before?" If Yes, ask: "When was the last time?" and "What happened that time?"      Yes, albuterol  PRN INH - provides relief of sx Abx and steroids, mucinex DM Reports used to see Dr. Zoila Hines? That has retired - [employer] of RAD 6. CARDIAC HISTORY: "Do you have any history of heart disease?" (e.g., heart attack, angina, bypass surgery, angioplasty)      denies 7. LUNG HISTORY: "Do you have any history of lung disease?"  (e.g., pulmonary embolus, asthma, emphysema)     RAD 8. CAUSE: "What do you think is causing the breathing problem?"      unknown 9. OTHER SYMPTOMS: "Do you have any other symptoms? (e.g., dizziness, runny nose, cough, chest pain, fever)     Cough Denies fever, URI sx  Protocols used: Breathing Difficulty-A-AH

## 2023-11-23 ENCOUNTER — Encounter: Payer: Self-pay | Admitting: Pulmonary Disease

## 2023-11-23 ENCOUNTER — Ambulatory Visit
Admission: RE | Admit: 2023-11-23 | Discharge: 2023-11-23 | Disposition: A | Discharge status: Home / Self Care | Source: Ambulatory Visit | Attending: Pulmonary Disease | Admitting: Pulmonary Disease

## 2023-11-23 ENCOUNTER — Ambulatory Visit: Payer: Self-pay | Admitting: Pulmonary Disease

## 2023-11-23 ENCOUNTER — Ambulatory Visit: Admitting: Pulmonary Disease

## 2023-11-23 ENCOUNTER — Other Ambulatory Visit
Admission: RE | Admit: 2023-11-23 | Discharge: 2023-11-23 | Disposition: A | Discharge status: Home / Self Care | Source: Ambulatory Visit | Attending: Pulmonary Disease | Admitting: Pulmonary Disease

## 2023-11-23 VITALS — BP 126/80 | HR 80 | Temp 97.8°F | Ht 63.0 in | Wt 194.8 lb

## 2023-11-23 DIAGNOSIS — R053 Chronic cough: Secondary | ICD-10-CM

## 2023-11-23 DIAGNOSIS — K200 Eosinophilic esophagitis: Secondary | ICD-10-CM | POA: Diagnosis not present

## 2023-11-23 DIAGNOSIS — R0609 Other forms of dyspnea: Secondary | ICD-10-CM | POA: Diagnosis not present

## 2023-11-23 DIAGNOSIS — J45998 Other asthma: Secondary | ICD-10-CM

## 2023-11-23 DIAGNOSIS — K2100 Gastro-esophageal reflux disease with esophagitis, without bleeding: Secondary | ICD-10-CM

## 2023-11-23 LAB — CBC WITH DIFFERENTIAL/PLATELET
Abs Immature Granulocytes: 0.02 10*3/uL (ref 0.00–0.07)
Basophils Absolute: 0 10*3/uL (ref 0.0–0.1)
Basophils Relative: 1 %
Eosinophils Absolute: 0.4 10*3/uL (ref 0.0–0.5)
Eosinophils Relative: 8 %
HCT: 41.8 % (ref 36.0–46.0)
Hemoglobin: 13.9 g/dL (ref 12.0–15.0)
Immature Granulocytes: 0 %
Lymphocytes Relative: 28 %
Lymphs Abs: 1.5 10*3/uL (ref 0.7–4.0)
MCH: 32.6 pg (ref 26.0–34.0)
MCHC: 33.3 g/dL (ref 30.0–36.0)
MCV: 98.1 fL (ref 80.0–100.0)
Monocytes Absolute: 0.4 10*3/uL (ref 0.1–1.0)
Monocytes Relative: 7 %
Neutro Abs: 3 10*3/uL (ref 1.7–7.7)
Neutrophils Relative %: 56 %
Platelets: 252 10*3/uL (ref 150–400)
RBC: 4.26 MIL/uL (ref 3.87–5.11)
RDW: 12.3 % (ref 11.5–15.5)
WBC: 5.4 10*3/uL (ref 4.0–10.5)
nRBC: 0 % (ref 0.0–0.2)

## 2023-11-23 LAB — NITRIC OXIDE: Nitric Oxide: 23

## 2023-11-23 MED ORDER — METHYLPREDNISOLONE 4 MG PO TBPK: ORAL_TABLET | ORAL | 0 refills | Status: DC

## 2023-11-23 MED ORDER — BUDESONIDE-FORMOTEROL FUMARATE 160-4.5 MCG/ACT IN AERO: 2.0000 | INHALATION_SPRAY | Freq: Two times a day (BID) | RESPIRATORY_TRACT | 12 refills | Status: AC

## 2023-11-23 MED ORDER — MONTELUKAST SODIUM 10 MG PO TABS: 10.0000 mg | ORAL_TABLET | Freq: Every day | ORAL | 2 refills | Status: AC

## 2023-11-23 NOTE — Patient Instructions (Signed)
 VISIT SUMMARY:  Today, you were seen for your chronic cough, shortness of breath, and wheezing. We discussed your history of reactive lung disease, eosinophilic esophagitis, and asthma. You have been experiencing a persistent cough for the past year, which has not improved with previous treatments. We reviewed your current medications and symptoms, and made some changes to your treatment plan to better manage your conditions.  YOUR PLAN:  -COUGH VARIANT ASTHMA: Cough variant asthma is a type of asthma where the main symptom is a chronic cough. We will switch your medication from Breztri to Symbicort to see if it helps reduce your cough. We will also start you on montelukast (Singulair) and a taper of prednisone. Additionally, we will order pulmonary function tests and a chest x-ray to further evaluate your condition.  -EOSINOPHILIC ESOPHAGITIS: Eosinophilic esophagitis is an allergic condition that causes inflammation of the esophagus. This may be contributing to your chronic cough and asthma symptoms. We will check your blood for eosinophils and allergies, and consider starting you on Dupixent for long-term management.  -GASTROESOPHAGEAL REFLUX DISEASE (GERD): GERD is a condition where stomach acid frequently flows back into the esophagus, causing irritation. This may be contributing to your cough and throat symptoms. We will prescribe omeprazole  twice daily for 1-2 weeks, then reduce to once daily. Additionally, we recommend lifestyle modifications to help manage your reflux.  -THYROID  MASS: You had a non-cancerous thyroid  mass removed, and you are experiencing a sensation of something in your throat. This could be related to your previous surgery or GERD. We will monitor this symptom as we manage your other conditions.  INSTRUCTIONS:  Please complete the blood work and chest x-ray at the main hospital building. Follow-up will be based on the x-ray results to determine if a high resolution CT chest  is needed.

## 2023-11-23 NOTE — Progress Notes (Signed)
 Subjective:    Patient ID: Denise Taylor, female    DOB: January 21, 1961, 63 y.o.   MRN: [id number]  Patient Care Team: Orest Bio, MD as PCP - General (Family Medicine) Devon Fogo, MD (Inactive) as Consulting Physician (Dermatology) Vinetta Greening, DO as Consulting Physician (Internal Medicine) Marc Senior, MD as Consulting Physician (Pulmonary Disease)  Chief Complaint  Patient presents with   Consult    Cough. Shortness of breath on exertion. Wheezing.     BACKGROUND: Patient is a 63 year old lifelong never smoker who presents for evaluation of cough of approximately 1 years duration worse over the last 6 months, shortness of breath on exertion and wheezing.  She is kindly referred by Dr. Alanna Hu.   HPI Discussed the use of AI scribe software for clinical note transcription with the patient, who gave verbal consent to proceed.  History of Present Illness   Denise Taylor is a 63 year old female with reactive lung disease and eosinophilic esophagitis who presents with chronic cough, shortness of breath, and wheezing. She was previously seen by Dr. Marykay Snipes for her symptoms.  Previously she used to follow with Dr. Tauna Farrow for reactive airways disease.  She has experienced a persistent cough for the past year, which has not been significantly alleviated by multiple courses of antibiotics and prednisone. The cough is severe enough to cause concern about rib fractures and sometimes produces a taste similar to epistaxis. She has visited urgent care twice for these symptoms.  She has a history of reactive lung disease, which causes her to react to strong scents such as perfume or smoke, leading to respiratory distress. She underwent a CT scan in 2020, which revealed scar tissue. She was unable to wear a mask at work due to her condition, leading to a period of short-term disability that was not compensated.  She has been on Breztri for about six months after switching  from Advair due to insurance issues. Denise Taylor has not significantly helped her cough. She also uses an albuterol  inhaler, which provides minimal relief. She has a history of reactive airways disease.  She has a history of eosinophilic esophagitis, diagnosed during a period of severe reflux. She was on omeprazole  twice daily during that time and now takes it as needed. She avoids foods that trigger her reflux, such as sausage and pepperoni. She describes a sensation of something stuck in her throat, likened to 'an apple peeling,' which temporarily clears with swallowing.  She has not had sputum production, no hemoptysis.  She underwent thyroid  surgery for a non-cancerous mass and reports a sensation of something in her throat when swallowing, which she attributes to the surgery. She also had part of her colon removed, though the details of this are not discussed further.   She works as a Teacher, early years/pre, has had no occupational exposures.  No [employer] service previously.  [place] in [place], [place]  with her husband.  DATA 02/19/2019 CT chest without contrast: Solitary 3 mm right middle lobe pulmonary nodule.  Minimal postinfectious/postinflammatory scarring at the anterior left lung base.  No acute pulmonary disease.  Small hiatal hernia.  Hypodense 1.5 cm posterior right thyroid  lobe nodule. 02/20/2019 PFTs: FEV1 1.86 L or 73% predicted FVC 2.31 L or 71% predicted, FEV1/FVC 80%, there was DRAMATIC bronchodilator response with FEV1 improving to 2.51 L or 99% predicted for a net change of 35% postbronchodilator.  Lung volumes were normal diffusion capacity normal.  Consistent with obstructive airways disease, asthmatic type. 05/16/2021 chest  x-ray PA and lateral: No acute findings.      Review of Systems A 10 point review of systems was performed and it is as noted above otherwise negative.   Past Medical History:  Diagnosis Date   Complication of anesthesia    PONV   Current use of estrogen  therapy 02/13/2014   Diverticulitis    4 episodes in the past 1-2 years.   Diverticulitis    Endometriosis    GERD (gastroesophageal reflux disease)    Indigestion 02/13/2014   Parathyroid  adenoma    PONV (postoperative nausea and vomiting)    Reactive airway disease    Following pneumonia x2 in 2019. Resolved.     Past Surgical History:  Procedure Laterality Date   ABDOMINAL HYSTERECTOMY     partial   BALLOON DILATION N/A 01/12/2020   Procedure: BALLOON DILATION;  Surgeon: Vinetta Greening, DO;  [facility];  Service: Endoscopy;  Laterality: N/A;   BIOPSY  01/12/2020   Procedure: BIOPSY;  Surgeon: Vinetta Greening, DO;  [facility]: [facility];  Service: Endoscopy;;   BREAST REDUCTION SURGERY     CHOLECYSTECTOMY  1999   COLON SURGERY     8-12 inches removed for diverticulitis   COLONOSCOPY  07/05/2012   Procedure: COLONOSCOPY;  Surgeon: Alyce Jubilee, MD;  Location: [facility];  Service: Endoscopy;  Laterality: N/A;  9:30   COLONOSCOPY N/A 05/07/2017   Procedure: COLONOSCOPY;  Surgeon: Alyce Jubilee, MD;  diverticulosis in the rectosigmoid colon, sigmoid colon, descending colon, and transverse colon, significant looping of the colon, external and internal hemorrhoids.  Recommended repeat exam in 5-10 years.    endometrial mass     ESOPHAGOGASTRODUODENOSCOPY  05/10/11   Alline Ivans: mild narrowing GE junction, Savory dilation   ESOPHAGOGASTRODUODENOSCOPY (EGD) WITH PROPOFOL  N/A 01/12/2020   Procedure: ESOPHAGOGASTRODUODENOSCOPY (EGD) WITH PROPOFOL ;  Surgeon: Vinetta Greening, DO;  Esophageal mucosal changes suggestive of eosinophilic esophagitis s/p biopsy (consistent with EOE), benign-appearing esophageal stenosis s/p dilation, irregular Z-line biopsied (no metaplasia or dysplasia), gastritis s/p biopsy (fundic gland polyp, no H. pylori), normal examined duodenum.   FOOT SURGERY Left    shorten bone   PARTIAL COLECTOMY N/A 07/02/2017   Procedure: PARTIAL COLECTOMY;  Surgeon:  Alanda Allegra, MD;  Location: [facility];  Service: General;  Laterality: N/A;   RECONSTRUCTION OF NOSE     SALPINGOOPHORECTOMY     right   SKIN SURGERY     back of left leg   THYROIDECTOMY Right 07/04/2019   Procedure: HEMITHYROIDECTOMY;  Surgeon: Reynold Caves, MD;  Location: [facility];  2 cm right posterior thyroid  mass.  Pathology revealed nodular hyperplasia, benign.   TONSILLECTOMY      Patient Active Problem List   Diagnosis Date Noted   Eosinophilic esophagitis 05/05/2020   S/P partial thyroidectomy 07/04/2019   Screening for colorectal cancer 04/21/2019   Encounter for well woman exam with routine gynecological exam 04/21/2019   GERD (gastroesophageal reflux disease) 01/30/2018   Dysphagia 07/31/2017   S/P partial colectomy 07/02/2017   Diverticulitis of sigmoid colon    History of colonic polyps    Diverticulitis of colon 05/02/2017   Current use of estrogen therapy 02/13/2014   Indigestion 02/13/2014   LLQ pain 06/27/2012   Encounter for screening colonoscopy 06/27/2012    Family History  Problem Relation Age of Onset   COPD Mother    Hypertension Father    Other Brother  paralyzed from waist down; fell out of deer stand   Other Daughter        born with 2 holes in heart   Heart disease Maternal Grandfather    COPD Maternal Grandfather    Stroke Paternal Grandmother    Stroke Paternal Grandfather    Colon cancer Cousin 33   Colon cancer Cousin 33    Social History   Tobacco Use   Smoking status: Never   Smokeless tobacco: Never  Substance Use Topics   Alcohol use: Yes    Comment: twice a week    Allergies  Allergen Reactions   Bee Venom Anaphylaxis   Codeine Nausea Only    Current Meds  Medication Sig   albuterol  (VENTOLIN  HFA) 108 (90 Base) MCG/ACT inhaler Inhale 2 puffs into the lungs every 6 (six) hours as needed for wheezing or shortness of breath.   budesonide-formoterol (SYMBICORT) 160-4.5 MCG/ACT inhaler Inhale 2 puffs into  the lungs 2 (two) times daily.   EPIPEN  2-PAK 0.3 MG/0.3ML SOAJ injection Inject 0.3 mg into the muscle as needed (bee stings).    estradiol  (VIVELLE -DOT) 0.0375 MG/24HR PLACE 1 PATCH ONTO THE SKIN 2 (TWO) TIMES A WEEK.   methylPREDNISolone  (MEDROL  DOSEPAK) 4 MG TBPK tablet Take as directed in the package this is a taper pack.   montelukast (SINGULAIR) 10 MG tablet Take 1 tablet (10 mg total) by mouth daily.   omeprazole  (PRILOSEC) 40 MG capsule TAKE 1 CAPSULE BY MOUTH BY MOUTH TWICE DAILY BEFORE A MEAL   ondansetron  (ZOFRAN ) 4 MG tablet Take 1 tablet (4 mg total) by mouth every 8 (eight) hours as needed for nausea or vomiting.   [DISCONTINUED] BREZTRI AEROSPHERE 160-9-4.8 MCG/ACT AERO inhaler Inhale 2 puffs into the lungs in the morning and at bedtime.    Immunization History  Administered Date(s) Administered   Influenza,inj,Quad PF,6+ Mos 01/10/2018, 03/19/2018   Influenza-Unspecified 02/03/2014, 01/09/2017   Td (Adult),5 Lf Tetanus Toxid, Preservative Free 08/22/1999   Tdap 11/20/2017   Zoster Recombinant(Shingrix) 09/11/2017, 01/10/2018        Objective:     BP 126/80 (BP Location: Left Arm, Patient Position: Sitting, Cuff Size: Normal)   Pulse 80   Temp 97.8 F (36.6 C) (Oral)   Ht 5' 3 (1.6 m)   Wt 194 lb 12.8 oz (88.4 kg)   SpO2 97%   BMI 34.51 kg/m   SpO2: 97 %  GENERAL: Well-developed, overweight woman, no acute distress.  Intermittently coughing, throat clearing.  Fully ambulatory. HEAD: Normocephalic, atraumatic.  EYES: Pupils equal, round, reactive to light.  No scleral icterus.  MOUTH: Dentition intact, oral mucosa moist.  No thrush. NECK: Supple.  Thyroidectomy scar present.  No thyromegaly. Trachea midline. No JVD.  No adenopathy. PULMONARY: Good air entry bilaterally.  No adventitious sounds. CARDIOVASCULAR: S1 and S2. Regular rate and rhythm.  No rubs, murmurs or gallops heard. ABDOMEN: Benign. MUSCULOSKELETAL: No joint deformity, no clubbing, no edema.   NEUROLOGIC: No overt focal deficit, no gait disturbance, speech is fluent. SKIN: Intact,warm,dry. PSYCH: Mood and behavior normal.  Lab Results  Component Value Date   NITRICOXIDE 23 11/23/2023  *No evidence of type II inflammation.       Assessment & Plan:     ICD-10-CM   1. Persistent asthma with undetermined severity  J45.998 DG Chest 2 View    CBC with Differential/Platelet    Allergen Panel (27) + IGE    Pulmonary function test    Nitric oxide    2.  Dyspnea on exertion  R06.09 Nitric oxide    3. Eosinophilic esophagitis  K20.0     4. Gastroesophageal reflux disease with esophagitis without hemorrhage  K21.00     5. Chronic cough  R05.3 DG Chest 2 View    Pulmonary function test    Nitric oxide      Orders Placed This Encounter  Procedures   DG Chest 2 View    Standing Status:   Future    Number of Occurrences:   1    Expected Date:   11/23/2023    Expiration Date:   11/22/2024    Reason for Exam (SYMPTOM  OR DIAGNOSIS REQUIRED):   Chronic cough, shortness of breath    Preferred imaging location?:    [facility]   CBC with Differential/Platelet    Standing Status:   Future    Number of Occurrences:   1    Expected Date:   11/23/2023    Expiration Date:   11/22/2024   Allergen Panel (27) + IGE    Standing Status:   Future    Number of Occurrences:   1    Expiration Date:   11/22/2024   Nitric oxide   Pulmonary function test    Standing Status:   Future    Expected Date:   12/07/2023    Expiration Date:   11/22/2024    Where should this test be performed?:   Outpatient Pul[facility]    What type of PFT is being ordered?:   Full PFT    Meds ordered this encounter  Medications   methylPREDNISolone  (MEDROL  DOSEPAK) 4 MG TBPK tablet    Sig: Take as directed in the package this is a taper pack.    Dispense:  21 tablet    Refill:  0   budesonide-formoterol (SYMBICORT) 160-4.5 MCG/ACT inhaler    Sig: Inhale 2 puffs into the lungs 2 (two) times daily.     Dispense:  10.2 g    Refill:  12   montelukast (SINGULAIR) 10 MG tablet    Sig: Take 1 tablet (10 mg total) by mouth daily.    Dispense:  30 tablet    Refill:  2   Discussion:    Persistent asthma/cough variant asthma Chronic cough for one year, not significantly improved with prednisone or antibiotics. Previous CT scan in 2020 showed minimal postinflammatory change. Pulmonary function tests from 2020 indicated an asthmatic response with FEV1 increasing from 73% to 99% post-bronchodilator. Current symptoms suggest cough variant asthma, possibly exacerbated by glycopyrrolate  in [place] (she has been on Breztri for 6 months and has noted worsening of the cough or as many months). Potential contributing factors include GERD and eosinophilic esophagitis. - Switch from Breztri to Symbicort to eliminate glycopyrrolate  component. - Order pulmonary function tests. - Prescribe montelukast (Singulair). - Prescribe a taper of Medrol . - Order chest x-ray to determine need for high resolution CT chest.  Eosinophilic esophagitis Eosinophilic esophagitis diagnosed during a period of severe reflux. Symptoms may be contributing to chronic cough and asthma exacerbation. Potential need for Dupixent to manage eosinophilic esophagitis and asthma. - Check blood work for eosinophils and allergies. - Consider Dupixent for long-term management.  Gastroesophageal reflux disease (GERD) GERD previously managed with omeprazole . Symptoms include sensation of something in throat (globus sensation) and possible reflux-related changes in throat. GERD may be contributing to cough and asthma symptoms. - Prescribe omeprazole  twice daily for 1-2 weeks, then reduce to once daily. - Advise on anti-reflux lifestyle modifications.  Thyroid   mass Non-cancerous thyroid  mass removal. Current sensation of something in throat, possibly related to previous surgery or GERD.  Follow-up - Perform blood work and chest x-ray at the main  hospital building. - Follow-up based on x-ray results to determine need for high resolution CT chest.     Advised if symptoms do not improve or worsen, to please contact office for sooner follow up or seek emergency care.    I spent 60 minutes of dedicated to the care of this patient on the date of this encounter to include pre-visit review of records, face-to-face time with the patient discussing conditions above, post visit ordering of testing, clinical documentation with the electronic health record, making appropriate referrals as documented, and communicating necessary findings to members of the patients care team.   C. Chloe Counter, MD Advanced Bronchoscopy PCCM [facility]    *This note was dictated using voice recognition software/Dragon.  Despite best efforts to proofread, errors can occur which can change the meaning. Any transcriptional errors that result from this process are unintentional and may not be fully corrected at the time of dictation.

## 2023-11-27 LAB — ALLERGEN PANEL (27) + IGE
Alternaria Alternata IgE: <0.1 kU/L
Aspergillus Fumigatus IgE: <0.1 kU/L
Bahia Grass IgE: <0.1 kU/L
Bermuda Grass IgE: <0.1 kU/L
Cat Dander IgE: <0.1 kU/L
Cedar, Mountain IgE: <0.1 kU/L
Cladosporium Herbarum IgE: <0.1 kU/L
Cocklebur IgE: <0.1 kU/L
Cockroach, American IgE: <0.1 kU/L
Common Silver Birch IgE: <0.1 kU/L
D Farinae IgE: <0.1 kU/L
D Pteronyssinus IgE: <0.1 kU/L
Dog Dander IgE: <0.1 kU/L
Elm, American IgE: <0.1 kU/L
Hickory, White IgE: <0.1 kU/L
IgE (Immunoglobulin E), Serum: 25 [IU]/mL (ref 6–495)
Johnson Grass IgE: <0.1 kU/L
Kentucky Bluegrass IgE: <0.1 kU/L
Maple/Box Elder IgE: <0.1 kU/L
Mucor Racemosus IgE: <0.1 kU/L
Oak, White IgE: <0.1 kU/L
Penicillium Chrysogen IgE: <0.1 kU/L
Pigweed, Rough IgE: <0.1 kU/L
Plantain, English IgE: <0.1 kU/L
Ragweed, Short IgE: <0.1 kU/L
Setomelanomma Rostrat: <0.1 kU/L
Timothy Grass IgE: <0.1 kU/L
White Mulberry IgE: <0.1 kU/L

## 2023-11-30 NOTE — Progress Notes (Signed)
 LVMTCB   E2C2 Please relay result note if pt calls back.

## 2024-01-10 ENCOUNTER — Encounter

## 2024-01-10 ENCOUNTER — Ambulatory Visit: Admitting: Pulmonary Disease

## 2024-02-21 ENCOUNTER — Encounter

## 2024-02-21 ENCOUNTER — Ambulatory Visit: Admitting: Pulmonary Disease

## 2024-05-08 ENCOUNTER — Ambulatory Visit: Admitting: Pulmonary Disease

## 2024-05-08 ENCOUNTER — Ambulatory Visit

## 2024-05-08 ENCOUNTER — Encounter: Payer: Self-pay | Admitting: Pulmonary Disease

## 2024-05-08 VITALS — BP 116/70 | HR 73 | Temp 97.6°F | Ht 63.0 in | Wt 191.0 lb

## 2024-05-08 DIAGNOSIS — K2100 Gastro-esophageal reflux disease with esophagitis, without bleeding: Secondary | ICD-10-CM

## 2024-05-08 DIAGNOSIS — R053 Chronic cough: Secondary | ICD-10-CM | POA: Diagnosis not present

## 2024-05-08 DIAGNOSIS — J4540 Moderate persistent asthma, uncomplicated: Secondary | ICD-10-CM

## 2024-05-08 DIAGNOSIS — K200 Eosinophilic esophagitis: Secondary | ICD-10-CM

## 2024-05-08 DIAGNOSIS — J45998 Other asthma: Secondary | ICD-10-CM

## 2024-05-08 LAB — PULMONARY FUNCTION TEST
DL/VA % pred: 109 %
DL/VA: 4.64 ml/min/mmHg/L
DLCO unc % pred: 115 %
DLCO unc: 22.17 ml/min/mmHg
FEF 25-75 Post: 2.51 L/s
FEF 25-75 Pre: 2.09 L/s
FEF2575-%Change-Post: 19 %
FEF2575-%Pred-Post: 113 %
FEF2575-%Pred-Pre: 94 %
FEV1-%Change-Post: 4 %
FEV1-%Pred-Post: 110 %
FEV1-%Pred-Pre: 105 %
FEV1-Post: 2.66 L
FEV1-Pre: 2.54 L
FEV1FVC-%Change-Post: 2 %
FEV1FVC-%Pred-Pre: 98 %
FEV6-%Change-Post: 2 %
FEV6-%Pred-Post: 112 %
FEV6-%Pred-Pre: 110 %
FEV6-Post: 3.4 L
FEV6-Pre: 3.33 L
FEV6FVC-%Change-Post: 0 %
FEV6FVC-%Pred-Post: 103 %
FEV6FVC-%Pred-Pre: 103 %
FVC-%Change-Post: 1 %
FVC-%Pred-Post: 108 %
FVC-%Pred-Pre: 106 %
FVC-Post: 3.4 L
FVC-Pre: 3.34 L
Post FEV1/FVC ratio: 78 %
Post FEV6/FVC ratio: 100 %
Pre FEV1/FVC ratio: 76 %
Pre FEV6/FVC Ratio: 100 %
RV % pred: 115 %
RV: 2.28 L
TLC % pred: 113 %
TLC: 5.57 L

## 2024-05-08 NOTE — Patient Instructions (Signed)
 Full PFT completed today ? ?

## 2024-05-08 NOTE — Progress Notes (Unsigned)
 Subjective:    Patient ID: Denise Taylor, female    DOB: Sep 09, 1960, 63 y.o.   MRN: [id number]  Patient Care Team: Atilano Deward ORN, MD as PCP - General (Family Medicine) Livingston Rigg, MD as Consulting Physician (Dermatology) Cindie Carlin POUR, DO as Consulting Physician (Internal Medicine) Tamea Dedra CROME, MD as Consulting Physician (Pulmonary Disease)  Chief Complaint  Patient presents with   Asthma    Occasional cough and wheezing on exertion.      BACKGROUND/INTERVAL:Patient is a 63 year old lifelong never smoker who presents for evaluation of cough of over a years duration associated with shortness of breath on exertion and wheezing.  Initially seen here on 23 November 2023 with a working diagnosis of asthma.  This is a scheduled follow-up visit.  Patient had PFTs today.  HPI Discussed the use of AI scribe software for clinical note transcription with the patient, who gave verbal consent to proceed.  History of Present Illness   Denise Taylor is a 63 year old female with asthma who presents for follow-up of her condition.  She has intrinsic asthma, which is not triggered by specific external factors, but certain scents, such as those encountered in drugstores, can exacerbate her symptoms.  She uses Symbicort  regularly and feels 'cloggy' despite taking her dose this morning.  In 2020, her FEV1 was 73% before albuterol  treatment and improved to 99% post-treatment, indicating a significant response. Currently, with Symbicort , her FEV1 is at 105%.  She recalls feeling 'at her wit's end' during her initial visit but notes significant improvement since then.  She does not endorse any shortness of breath.  This has been relieved by Symbicort .  Does not endorse any other symptomatology.  Previously obtain allergen panels and CBC with differential showed no significant abnormalities, specifically no eosinophilia and no significant allergic triggers.  She has a history of eosinophilic  esophagitis and gastroesophageal reflux but the symptoms are currently well-controlled.  She is currently on omeprazole .    DATA 02/19/2019 CT chest without contrast: Solitary 3 mm right middle lobe pulmonary nodule.  Minimal postinfectious/postinflammatory scarring at the anterior left lung base.  No acute pulmonary disease.  Small hiatal hernia.  Hypodense 1.5 cm posterior right thyroid  lobe nodule. 02/20/2019 PFTs: FEV1 1.86 L or 73% predicted FVC 2.31 L or 71% predicted, FEV1/FVC 80%, there was DRAMATIC bronchodilator response with FEV1 improving to 2.51 L or 99% predicted for a net change of 35% postbronchodilator.  Lung volumes were normal diffusion capacity normal.  Consistent with obstructive airways disease, asthmatic type. 05/16/2021 chest x-ray PA and lateral: No acute findings. 05/08/2024 PFTs: FEV1 2.54 L or 105% predicted, FVC 3.34 liters or 106% predicted, FEV1/FVC 76%, no bronchodilator response.  Normal lung volumes.  Normal diffusion capacity.  Normal study.  Review of Systems A 10 point review of systems was performed and it is as noted above otherwise negative.   Patient Active Problem List   Diagnosis Date Noted   Eosinophilic esophagitis 05/05/2020   S/P partial thyroidectomy 07/04/2019   Screening for colorectal cancer 04/21/2019   Encounter for well woman exam with routine gynecological exam 04/21/2019   GERD (gastroesophageal reflux disease) 01/30/2018   Dysphagia 07/31/2017   S/P partial colectomy 07/02/2017   Diverticulitis of sigmoid colon    History of colonic polyps    Diverticulitis of colon 05/02/2017   Current use of estrogen therapy 02/13/2014   Indigestion 02/13/2014   LLQ pain 06/27/2012   Encounter for screening colonoscopy 06/27/2012    Social History  Tobacco Use   Smoking status: Never   Smokeless tobacco: Never  Substance Use Topics   Alcohol use: Yes    Comment: twice a week    Allergies  Allergen Reactions   Bee Venom Anaphylaxis    Codeine Nausea Only    Current Meds  Medication Sig   albuterol  (VENTOLIN  HFA) 108 (90 Base) MCG/ACT inhaler Inhale 2 puffs into the lungs every 6 (six) hours as needed for wheezing or shortness of breath.   budesonide -formoterol  (SYMBICORT ) 160-4.5 MCG/ACT inhaler Inhale 2 puffs into the lungs 2 (two) times daily.   EPIPEN  2-PAK 0.3 MG/0.3ML SOAJ injection Inject 0.3 mg into the muscle as needed (bee stings).    estradiol  (VIVELLE -DOT) 0.0375 MG/24HR PLACE 1 PATCH ONTO THE SKIN 2 (TWO) TIMES A WEEK.   montelukast  (SINGULAIR ) 10 MG tablet Take 1 tablet (10 mg total) by mouth daily.   omeprazole  (PRILOSEC) 40 MG capsule TAKE 1 CAPSULE BY MOUTH BY MOUTH TWICE DAILY BEFORE A MEAL   ondansetron  (ZOFRAN ) 4 MG tablet Take 1 tablet (4 mg total) by mouth every 8 (eight) hours as needed for nausea or vomiting.    Immunization History  Administered Date(s) Administered   Influenza,inj,Quad PF,6+ Mos 01/10/2018, 03/19/2018, 03/04/2024   Influenza-Unspecified 02/03/2014, 01/09/2017   Td (Adult),5 Lf Tetanus Toxid, Preservative Free 08/22/1999   Tdap 11/20/2017   Zoster Recombinant(Shingrix) 09/11/2017, 01/10/2018        Objective:     Vitals:   05/08/24 1512  BP: 116/70  Pulse: 73  Temp: 97.6 F (36.4 C)  Height: 5' 3 (1.6 m)  Weight: 191 lb (86.6 kg)  SpO2: 100%  TempSrc: Temporal  BMI (Calculated): 33.84     SpO2: 100 %  GENERAL: Well-developed, overweight woman, no acute distress.  Intermittently coughing, throat clearing.  Fully ambulatory. HEAD: Normocephalic, atraumatic.  EYES: Pupils equal, round, reactive to light.  No scleral icterus.  MOUTH: Dentition intact, oral mucosa moist.  No thrush. NECK: Supple.  Thyroidectomy scar present.  No thyromegaly. Trachea midline. No JVD.  No adenopathy. PULMONARY: Good air entry bilaterally.  No adventitious sounds. CARDIOVASCULAR: S1 and S2. Regular rate and rhythm.  No rubs, murmurs or gallops heard. ABDOMEN:  Benign. MUSCULOSKELETAL: No joint deformity, no clubbing, no edema.  NEUROLOGIC: No overt focal deficit, no gait disturbance, speech is fluent. SKIN: Intact,warm,dry. PSYCH: Mood and behavior normal.  Recent Results (from the past 2160 hours)  Pulmonary function test     Status: None (Preliminary result)   Collection Time: 05/08/24  1:52 PM  Result Value Ref Range   FVC-Pre 3.34 L   FVC-%Pred-Pre 106 %   FVC-Post 3.40 L   FVC-%Pred-Post 108 %   FVC-%Change-Post 1 %   FEV1-Pre 2.54 L   FEV1-%Pred-Pre 105 %   FEV1-Post 2.66 L   FEV1-%Pred-Post 110 %   FEV1-%Change-Post 4 %   FEV6-Pre 3.33 L   FEV6-%Pred-Pre 110 %   FEV6-Post 3.40 L   FEV6-%Pred-Post 112 %   FEV6-%Change-Post 2 %   Pre FEV1/FVC ratio 76 %   FEV1FVC-%Pred-Pre 98 %   Post FEV1/FVC ratio 78 %   FEV1FVC-%Change-Post 2 %   Pre FEV6/FVC Ratio 100 %   FEV6FVC-%Pred-Pre 103 %   Post FEV6/FVC ratio 100 %   FEV6FVC-%Pred-Post 103 %   FEV6FVC-%Change-Post 0 %   FEF 25-75 Pre 2.09 L/sec   FEF2575-%Pred-Pre 94 %   FEF 25-75 Post 2.51 L/sec   FEF2575-%Pred-Post 113 %   FEF2575-%Change-Post 19 %   RV 2.28  L   RV % pred 115 %   TLC 5.57 L   TLC % pred 113 %   DLCO unc 22.17 ml/min/mmHg   DLCO unc % pred 115 %   DL/VA 5.35 ml/min/mmHg/L   DL/VA % pred 890 %  *Discussed PFT results with patient.       Assessment & Plan:     ICD-10-CM   1. Moderate persistent asthma without complication  J45.40     2. Chronic cough  R05.3     3. Eosinophilic esophagitis  K20.0     4. Gastroesophageal reflux disease with esophagitis without hemorrhage  K21.00      Discussion:    Asthma, uncomplicated Asthma is well-controlled with Symbicort . Pulmonary function tests show significant improvement from 2020, with FEV1 increasing from 73% to 105% after albuterol . Current FEV1 is at baseline with Symbicort , indicating effective management. No specific triggers identified, suggesting intrinsic asthma. No need for additional  interventions at this time. - Continue Symbicort  as prescribed. - Scheduled follow-up appointment in four months.     Will see the patient in follow-up in 4 months time.  Advised if symptoms do not improve or worsen, to please contact office for sooner follow up or seek emergency care.    I spent 32 minutes of dedicated to the care of this patient on the date of this encounter to include pre-visit review of records, face-to-face time with the patient discussing conditions above, post visit ordering of testing, clinical documentation with the electronic health record, making appropriate referrals as documented, and communicating necessary findings to members of the patients care team.     C. Leita Sanders, MD Advanced Bronchoscopy PCCM [facility]    *This note was generated using voice recognition software/Dragon and/or AI transcription program.  Despite best efforts to proofread, errors can occur which can change the meaning. Any transcriptional errors that result from this process are unintentional and may not be fully corrected at the time of dictation.

## 2024-05-08 NOTE — Progress Notes (Signed)
 Full PFT completed today ? ?

## 2024-05-08 NOTE — Patient Instructions (Signed)
 VISIT SUMMARY:  Today, we reviewed your asthma condition and its management. You mentioned that certain scents can worsen your symptoms, but overall, you have seen significant improvement since your initial visit.  YOUR PLAN:  -ASTHMA, UNCOMPLICATED: Asthma is a condition where your airways narrow and swell, making it difficult to breathe. Your asthma is well-controlled with your current medication, Symbicort . Your lung function has significantly improved since 2020, and your current lung function is stable. Continue taking Symbicort  as prescribed.  INSTRUCTIONS:  Please continue taking Symbicort  as prescribed. Your next follow-up appointment is scheduled in four months.

## 2024-05-09 ENCOUNTER — Encounter: Payer: Self-pay | Admitting: Pulmonary Disease

## 2024-09-08 ENCOUNTER — Ambulatory Visit: Admitting: Pulmonary Disease
